# Patient Record
Sex: Female | Born: 1990
Health system: Southern US, Community
[De-identification: ages and names within clinical notes are randomized; demographics above are authoritative.]

## PROBLEM LIST (undated history)

## (undated) ENCOUNTER — Inpatient Hospital Stay (HOSPITAL_COMMUNITY): Payer: Self-pay

## (undated) DIAGNOSIS — R000 Tachycardia, unspecified: Secondary | ICD-10-CM

## (undated) DIAGNOSIS — R519 Headache, unspecified: Secondary | ICD-10-CM

## (undated) DIAGNOSIS — R51 Headache: Secondary | ICD-10-CM

## (undated) DIAGNOSIS — IMO0002 Reserved for concepts with insufficient information to code with codable children: Secondary | ICD-10-CM

## (undated) DIAGNOSIS — R109 Unspecified abdominal pain: Secondary | ICD-10-CM

## (undated) HISTORY — DX: Headache, unspecified: R51.9

## (undated) HISTORY — DX: Headache: R51

## (undated) HISTORY — PX: NO PAST SURGERIES: SHX2092

---

## 2009-11-09 ENCOUNTER — Emergency Department (HOSPITAL_COMMUNITY): Admission: EM | Admit: 2009-11-09 | Discharge: 2009-11-09 | Payer: Self-pay | Admitting: Emergency Medicine

## 2010-04-16 ENCOUNTER — Other Ambulatory Visit: Payer: Self-pay | Admitting: Emergency Medicine

## 2010-09-01 ENCOUNTER — Ambulatory Visit: Payer: Self-pay | Admitting: General Practice

## 2010-12-03 ENCOUNTER — Ambulatory Visit: Payer: Self-pay

## 2010-12-07 ENCOUNTER — Emergency Department (HOSPITAL_COMMUNITY): Payer: PRIVATE HEALTH INSURANCE

## 2010-12-07 ENCOUNTER — Emergency Department (HOSPITAL_COMMUNITY)
Admission: EM | Admit: 2010-12-07 | Discharge: 2010-12-08 | Disposition: A | Discharge status: Home / Self Care | Payer: PRIVATE HEALTH INSURANCE | Attending: Emergency Medicine | Admitting: Emergency Medicine

## 2010-12-07 ENCOUNTER — Encounter (HOSPITAL_COMMUNITY): Payer: Self-pay | Admitting: Radiology

## 2010-12-07 DIAGNOSIS — R42 Dizziness and giddiness: Secondary | ICD-10-CM | POA: Insufficient documentation

## 2010-12-07 DIAGNOSIS — R002 Palpitations: Secondary | ICD-10-CM | POA: Insufficient documentation

## 2010-12-07 DIAGNOSIS — R51 Headache: Secondary | ICD-10-CM | POA: Insufficient documentation

## 2010-12-07 LAB — CBC
HCT: 38.5 % (ref 36.0–46.0)
Hemoglobin: 13.7 g/dL (ref 12.0–15.0)
MCV: 84.4 fL (ref 78.0–100.0)
RBC: 4.56 MIL/uL (ref 3.87–5.11)
RDW: 12.5 % (ref 11.5–15.5)
WBC: 6.9 10*3/uL (ref 4.0–10.5)

## 2010-12-07 LAB — DIFFERENTIAL
Eosinophils Relative: 2 % (ref 0–5)
Lymphocytes Relative: 36 % (ref 12–46)
Lymphs Abs: 2.5 10*3/uL (ref 0.7–4.0)

## 2010-12-07 LAB — BASIC METABOLIC PANEL
BUN: 10 mg/dL (ref 6–23)
CO2: 27 mEq/L (ref 19–32)
Chloride: 103 mEq/L (ref 96–112)
Creatinine, Ser: 0.78 mg/dL (ref 0.50–1.10)
Glucose, Bld: 91 mg/dL (ref 70–99)

## 2010-12-07 LAB — URINALYSIS, ROUTINE W REFLEX MICROSCOPIC
Bilirubin Urine: NEGATIVE
Specific Gravity, Urine: 1.007 (ref 1.005–1.030)
Urobilinogen, UA: 0.2 mg/dL (ref 0.0–1.0)

## 2010-12-07 LAB — URINE MICROSCOPIC-ADD ON

## 2010-12-08 ENCOUNTER — Emergency Department (HOSPITAL_COMMUNITY): Payer: PRIVATE HEALTH INSURANCE

## 2010-12-08 ENCOUNTER — Encounter (HOSPITAL_COMMUNITY): Payer: Self-pay | Admitting: Radiology

## 2010-12-08 MED ORDER — IOHEXOL 350 MG/ML SOLN: 100.0000 mL | Freq: Once | INTRAVENOUS | Status: DC | PRN

## 2013-01-16 ENCOUNTER — Other Ambulatory Visit (HOSPITAL_COMMUNITY): Payer: Self-pay | Admitting: Obstetrics & Gynecology

## 2013-01-16 DIAGNOSIS — N979 Female infertility, unspecified: Secondary | ICD-10-CM

## 2013-01-16 DIAGNOSIS — N912 Amenorrhea, unspecified: Secondary | ICD-10-CM

## 2013-01-23 ENCOUNTER — Ambulatory Visit (HOSPITAL_COMMUNITY)
Admission: RE | Admit: 2013-01-23 | Discharge: 2013-01-23 | Disposition: A | Discharge status: Home / Self Care | Payer: 59 | Source: Ambulatory Visit | Attending: Obstetrics & Gynecology | Admitting: Obstetrics & Gynecology

## 2013-01-23 DIAGNOSIS — N912 Amenorrhea, unspecified: Secondary | ICD-10-CM

## 2013-01-23 DIAGNOSIS — N979 Female infertility, unspecified: Secondary | ICD-10-CM | POA: Insufficient documentation

## 2013-01-23 MED ORDER — IOHEXOL 300 MG/ML  SOLN
20.0000 mL | Freq: Once | INTRAMUSCULAR | Status: AC | PRN
  Administered 2013-01-23: 20 mL

## 2013-02-21 NOTE — L&D Delivery Note (Signed)
Delivery Note At 8:34 PM a viable and healthy female was delivered via Vaginal, Spontaneous Delivery (Presentation: LOA ).  APGAR: 8, 9; weight pending .   Placenta status: spontaneous, intact.  Cord: with the following complications: none.  Cord pH: na  Anesthesia:  Epidural Episiotomy:none   Lacerations:  second Suture Repair: 2.0 3.0 vicryl rapide Est. Blood Loss (mL):  300  Mom to postpartum.  Baby to Couplet care / Skin to Skin.  Jullianna Gabor J 02/16/2014, 8:49 PM

## 2013-07-24 LAB — OB RESULTS CONSOLE RUBELLA ANTIBODY, IGM: RUBELLA: IMMUNE

## 2013-07-24 LAB — OB RESULTS CONSOLE HEPATITIS B SURFACE ANTIGEN: Hepatitis B Surface Ag: NEGATIVE

## 2013-07-24 LAB — OB RESULTS CONSOLE ABO/RH: RH Type: POSITIVE

## 2013-07-24 LAB — OB RESULTS CONSOLE HIV ANTIBODY (ROUTINE TESTING): HIV: NONREACTIVE

## 2013-07-24 LAB — OB RESULTS CONSOLE RPR: RPR: NONREACTIVE

## 2013-07-24 LAB — OB RESULTS CONSOLE ANTIBODY SCREEN: ANTIBODY SCREEN: NEGATIVE

## 2013-07-31 LAB — OB RESULTS CONSOLE GC/CHLAMYDIA
Chlamydia: NEGATIVE
Gonorrhea: NEGATIVE

## 2014-01-15 ENCOUNTER — Observation Stay (HOSPITAL_COMMUNITY)
Admission: AD | Admit: 2014-01-15 | Discharge: 2014-01-16 | Disposition: A | Discharge status: Home / Self Care | Payer: 59 | Source: Ambulatory Visit | Attending: Obstetrics & Gynecology | Admitting: Obstetrics & Gynecology

## 2014-01-15 ENCOUNTER — Encounter (HOSPITAL_COMMUNITY): Payer: Self-pay | Admitting: Obstetrics & Gynecology

## 2014-01-15 ENCOUNTER — Inpatient Hospital Stay (HOSPITAL_COMMUNITY): Payer: 59

## 2014-01-15 DIAGNOSIS — Z3A34 34 weeks gestation of pregnancy: Secondary | ICD-10-CM | POA: Diagnosis not present

## 2014-01-15 DIAGNOSIS — Z79899 Other long term (current) drug therapy: Secondary | ICD-10-CM | POA: Insufficient documentation

## 2014-01-15 DIAGNOSIS — R000 Tachycardia, unspecified: Secondary | ICD-10-CM | POA: Diagnosis not present

## 2014-01-15 DIAGNOSIS — O26893 Other specified pregnancy related conditions, third trimester: Principal | ICD-10-CM | POA: Insufficient documentation

## 2014-01-15 DIAGNOSIS — R109 Unspecified abdominal pain: Secondary | ICD-10-CM

## 2014-01-15 DIAGNOSIS — IMO0002 Reserved for concepts with insufficient information to code with codable children: Secondary | ICD-10-CM | POA: Insufficient documentation

## 2014-01-15 HISTORY — DX: Tachycardia, unspecified: R00.0

## 2014-01-15 HISTORY — DX: Reserved for concepts with insufficient information to code with codable children: IMO0002

## 2014-01-15 HISTORY — DX: Unspecified abdominal pain: R10.9

## 2014-01-15 LAB — URINALYSIS, ROUTINE W REFLEX MICROSCOPIC
Bilirubin Urine: NEGATIVE
GLUCOSE, UA: NEGATIVE mg/dL
Ketones, ur: 40 mg/dL — AB
LEUKOCYTES UA: NEGATIVE
Nitrite: NEGATIVE
PROTEIN: NEGATIVE mg/dL
SPECIFIC GRAVITY, URINE: 1.01 (ref 1.005–1.030)
UROBILINOGEN UA: 0.2 mg/dL (ref 0.0–1.0)
pH: 6.5 (ref 5.0–8.0)

## 2014-01-15 LAB — CBC WITH DIFFERENTIAL/PLATELET
Basophils Absolute: 0 10*3/uL (ref 0.0–0.1)
Basophils Relative: 0 % (ref 0–1)
EOS ABS: 0.1 10*3/uL (ref 0.0–0.7)
Eosinophils Relative: 1 % (ref 0–5)
HCT: 39.9 % (ref 36.0–46.0)
HEMOGLOBIN: 14.1 g/dL (ref 12.0–15.0)
LYMPHS ABS: 0.9 10*3/uL (ref 0.7–4.0)
LYMPHS PCT: 7 % — AB (ref 12–46)
MCH: 32.3 pg (ref 26.0–34.0)
MCHC: 35.3 g/dL (ref 30.0–36.0)
MCV: 91.3 fL (ref 78.0–100.0)
Monocytes Absolute: 0.8 10*3/uL (ref 0.1–1.0)
Monocytes Relative: 6 % (ref 3–12)
NEUTROS ABS: 12 10*3/uL — AB (ref 1.7–7.7)
NEUTROS PCT: 86 % — AB (ref 43–77)
PLATELETS: 174 10*3/uL (ref 150–400)
RBC: 4.37 MIL/uL (ref 3.87–5.11)
RDW: 13.1 % (ref 11.5–15.5)
WBC: 13.8 10*3/uL — AB (ref 4.0–10.5)

## 2014-01-15 LAB — BASIC METABOLIC PANEL
Anion gap: 13 (ref 5–15)
BUN: 7 mg/dL (ref 6–23)
CHLORIDE: 100 meq/L (ref 96–112)
CO2: 23 mEq/L (ref 19–32)
Calcium: 8.4 mg/dL (ref 8.4–10.5)
Creatinine, Ser: 0.58 mg/dL (ref 0.50–1.10)
GFR calc Af Amer: >90 mL/min (ref >90)
GLUCOSE: 77 mg/dL (ref 70–99)
POTASSIUM: 3.6 meq/L — AB (ref 3.7–5.3)
SODIUM: 136 meq/L — AB (ref 137–147)

## 2014-01-15 LAB — URINE MICROSCOPIC-ADD ON

## 2014-01-15 MED ORDER — CEFTRIAXONE SODIUM IN DEXTROSE 20 MG/ML IV SOLN
1.0000 g | Freq: Once | INTRAVENOUS | Status: AC
  Administered 2014-01-15: 1 g via INTRAVENOUS
  Filled 2014-01-15: qty 50

## 2014-01-15 MED ORDER — TAMSULOSIN HCL 0.4 MG PO CAPS
0.4000 mg | ORAL_CAPSULE | Freq: Every day | ORAL | Status: DC
  Administered 2014-01-15 – 2014-01-16 (×2): 0.4 mg via ORAL
  Filled 2014-01-15 (×3): qty 1

## 2014-01-15 MED ORDER — POTASSIUM CHLORIDE CRYS ER 20 MEQ PO TBCR
20.0000 meq | EXTENDED_RELEASE_TABLET | Freq: Once | ORAL | Status: AC
  Administered 2014-01-15: 20 meq via ORAL
  Filled 2014-01-15: qty 1

## 2014-01-15 MED ORDER — DEXTROSE IN LACTATED RINGERS 5 % IV SOLN: INTRAVENOUS | Status: DC

## 2014-01-15 MED ORDER — HYDROMORPHONE HCL 1 MG/ML IJ SOLN
1.0000 mg | INTRAMUSCULAR | Status: DC | PRN
  Administered 2014-01-15: 1 mg via INTRAVENOUS
  Filled 2014-01-15: qty 1

## 2014-01-15 MED ORDER — DEXTROSE-NACL 5-0.9 % IV SOLN
INTRAVENOUS | Status: DC
  Administered 2014-01-15 – 2014-01-16 (×2): via INTRAVENOUS

## 2014-01-15 MED ORDER — CEFTRIAXONE SODIUM 1 G IJ SOLR: 1.0000 g | Freq: Once | INTRAMUSCULAR | Status: DC

## 2014-01-15 NOTE — MAU Note (Signed)
Patient states she started having right flank pain at 1200 that has gotten worse over the course of the day. Was seen in the office and sent to MAU for evaluation.  Has a lot of mild contractions today, no bleeding or leaking. Reports good fetal movement.

## 2014-01-15 NOTE — MAU Provider Note (Signed)
History     CSN: [id number]  Arrival date and time: 01/15/14 1739     Chief Complaint  Patient presents with  . Flank Pain   HPI Comments: G1 @34 .3 wks c/o right flank pain with acute onset earlier today. Since then has noticed polyuria and urgency. No hematuria. No fever or chills. No N/V. Reports decreased appetite since this am. Good FM. No LOF or VB. Irregular, non-painful ctx.   Flank Pain    OB History    Gravida Para Term Preterm AB TAB SAB Ectopic Multiple Living   1               Past Medical History  Diagnosis Date  . Acute right flank pain 01/15/2014  . Marginal insertion of umbilical cord 83/38/2505  . Tachycardia     Past Surgical History  Procedure Laterality Date  . No past surgeries      History reviewed. No pertinent family history.  History  Substance Use Topics  . Smoking status: Never Smoker   . Smokeless tobacco: Not on file  . Alcohol Use: No    Allergies: No Known Allergies  Prescriptions prior to admission  Medication Sig Dispense Refill Last Dose  . Prenatal Vit-Fe Fumarate-FA (PRENATAL MULTIVITAMIN) TABS tablet Take 1 tablet by mouth at bedtime.   Past Week at Unknown time    Review of Systems  Constitutional: Negative.   HENT: Negative.   Eyes: Negative.   Respiratory: Negative.   Cardiovascular: Negative.   Gastrointestinal: Negative.   Genitourinary: Positive for urgency, frequency and flank pain.  Musculoskeletal: Negative.   Skin: Negative.   Neurological: Negative.   Endo/Heme/Allergies: Negative.   Psychiatric/Behavioral: Negative.    Physical Exam   Blood pressure 116/63, pulse 109, temperature 98.6 F (37 C), temperature source Oral, resp. rate 16, height 5\' 7"  (1.702 m), weight 72.576 kg (160 lb), SpO2 100 %.  Physical Exam  Constitutional: She is oriented to person, place, and time. She appears well-developed and well-nourished.  HENT:  Head: Normocephalic and atraumatic.  Eyes: Pupils are equal, round,  and reactive to light.  Neck: Normal range of motion. Neck supple.  Cardiovascular: Normal rate and regular rhythm.   Respiratory: Effort normal and breath sounds normal.  GI: Soft. Bowel sounds are normal. She exhibits no distension. There is no tenderness. There is no rebound and no guarding.  Gravid +CVAT on right  Genitourinary:  SVE: 0/0/high  Musculoskeletal: Normal range of motion.  Neurological: She is alert and oriented to person, place, and time.  Skin: Skin is warm and dry.  Psychiatric: She has a normal mood and affect.  EFM: 135 bpm, mod variability, + accels, no decels Toco: irregular, mild  MAU Course  Procedures CBC UA-+ketones, +blood Sono:  FINDINGS: Right Kidney: Length: 14.3 cm. Echogenicity and renal cortical thickness are within normal limits. No mass or perinephric fluid visualized. There is moderate hydronephrosis and proximal ureterectasis on the right. No calculus is appreciable in the right kidney or visualize proximal right ureter. Left Kidney: Length: 12.6 cm. Echogenicity and renal cortical thickness are within normal limits. No mass or perinephric fluid visualized. There is minimal pelvicaliectasis on the left. No sonographically demonstrable calculus or ureterectasis. Bladder: Appears normal for degree of bladder distention. There is a third trimester intrauterine gestation causing impression on the urinary bladder. IMPRESSION: Moderate hydronephrosis and ureterectasis on the right. This finding is concerning for potential distal ureteral obstruction. Minimal fullness of the left renal collecting system may be due  to the intrauterine gestation causing impression on the left ureter. There is no mass. There is no visualized intrarenal or proximal right ureteral calculus. Study otherwise unremarkable.  MDM n/a  Assessment and Plan  34.[redacted] weeks gestation Suspected pyelonephritis  Admit, IV hydration and abx, analgesic, strain urine.   Dr. Benjie Karvonen to follow.   Anna Martin, [place], N 01/15/2014, 8:17 PM

## 2014-01-15 NOTE — H&P (Signed)
CC: Flank Pain since this afternoon, getting worse.   23 yo, G2P0 at 34.3 wks, presented back to office this afternoon with acute right flank pain and urinary frequency, urgency. No hematuria or dysuria. No renal stone hx in past. No fever/ chills. Does not have an appetite since flank pain started and has not been able to eat much all day but is able to drink a lot of water without nausea/vomiting. Patient woke up feeling fine, came to office for Ob routine visit, was evaluated with flank taps to check for CVA tenderness due to c/o noting blood on wiping after BM few days back, no vaginal bleeding noted.  Udip then was neg gluc/prot but this afternoon complete UA done that noted Ketones and small blood but neg Leuk/nitrates.  Feels some UCs, not very painful. No vag bleeding or leaking fluid. Good FMs noted.   Past Medical History  Diagnosis Date  . Acute right flank pain 01/15/2014  . Marginal insertion of umbilical cord 40/34/7425  . Tachycardia    Past Surgical History  Procedure Laterality Date  . No past surgeries      History reviewed. No pertinent family history.  History  Substance Use Topics  . Smoking status: Never Smoker   . Smokeless tobacco: Not on file  . Alcohol Use: No    Allergies: No Known Allergies  Prescriptions prior to admission  Medication Sig Dispense Refill Last Dose  . Prenatal Vit-Fe Fumarate-FA (PRENATAL MULTIVITAMIN) TABS tablet Take 1 tablet by mouth at bedtime.   Past Week at Unknown time    Review of Systems  Genitourinary: Positive for flank pain.   Physical Exam   Physical Exam  A&O x 3, no acute distress but appears uncomfortable HEENT negLungs CTA bilat CV mild tachycardia noted in 100s Abdo soft, non tender, non acute, uterus gravid, relaxed with palpable BH contractions. Rest of the abdomen in non tender/ no rebound/ guarding.  Back Right SI joint tenderness noted and also right flank tenderness noted.  Extr no edema/ tenderness.  Psoas test/ hip flexion negative.  Pelvic deferred   MAU Course  Procedures PO hydrate with juice, repeat UA in 1 hr for ketones and blood CBCD, BMP - labs reviewed, low K, hight WBC with left shift Renal sono - Right moderate hydronephrosis with ureter dilatation, no obvious stones noted NST- reactive.   Assessment and Plan  23 yo with acute right flank tenderness. 34.3 wk pregnancy.  D/D- Right ureteral stone with hydronephrosis or acute right pyelonephritis. Less likely appendicitis (CBCD pending, though normal non tender/ non surgical abdomen).  Admit, Rocephin, Flomax, Dilauded PRN, IVFluids, strain urine, replace low K, NST daily. CBCD in AM.    Prudencio Velazco R 01/15/2014, 8:03 PM

## 2014-01-15 NOTE — MAU Provider Note (Signed)
  History     CSN: [id number]  Arrival date and time: 01/15/14 1739   Chief Complaint  Patient presents with  . Flank Pain   HPI 23 yo, G2P0 at 34 wks, presented back to office this afternoon with acute right flank pain and urinary frequency, urgency. No hematuria or dysuria. No renal stone hx in past. No fever/ chills. Does not have an appetite since flank pain started and has not been able to eat much all day but is able to drink a lot of water without nausea/vomiting. Patient woke up feeling fine, came to office for Ob routine visit, was evaluated with flank taps to check for CVA tenderness due to c/o noting blood on wiping after BM few days back, no vaginal bleeding noted.  Udip then was neg gluc/prot but this afternoon complete UA done that noted Ketones and small blood but neg Leuk/nitrates.   Past Medical History  Diagnosis Date  . Acute right flank pain 01/15/2014  . Marginal insertion of umbilical cord 27/07/2374   No past surgical history on file.  No family history on file.  History  Substance Use Topics  . Smoking status: Not on file  . Smokeless tobacco: Not on file  . Alcohol Use: Not on file    Allergies: No Known Allergies  No prescriptions prior to admission    ROS Physical Exam   Physical Exam  A&O x 3, no acute distress but appears uncomfortable HEENT negLungs CTA bilat CV mild tachycardia noted in 100s Abdo soft, non tender, non acute, uterus gravid, relaxed with palpable BH contractions. Rest of the abdomen in non tender/ no rebound/ guarding.  Back Right SI joint tenderness noted and also right flank tenderness noted.  Extr no edema/ tenderness. Psoas test/ hip flexion negative.  Pelvic deferred   MAU Course  Procedures PO hydrate with juice, repeat UA in 1 hr for ketones and blood CBCD, BMP Renal sono  NST  Assessment and Plan  23 yo with acute right flank tenderness. 34 wk pregnancy D/D- Right renal stone/ hydronephrosis of pregnancy/  musculoskeletal pain/ right pyelonephritis (though don't see leuk/nitrate), more atypical- appendicitis (CBCD pending, though normal non tender/ non surgical abdomen)./   Anna Martin R 01/15/2014, 5:54 PM

## 2014-01-16 LAB — CBC WITH DIFFERENTIAL/PLATELET
BASOS ABS: 0 10*3/uL (ref 0.0–0.1)
Basophils Relative: 0 % (ref 0–1)
Eosinophils Absolute: 0.2 10*3/uL (ref 0.0–0.7)
Eosinophils Relative: 2 % (ref 0–5)
HCT: 35.2 % — ABNORMAL LOW (ref 36.0–46.0)
Hemoglobin: 12.2 g/dL (ref 12.0–15.0)
Lymphocytes Relative: 14 % (ref 12–46)
Lymphs Abs: 1.1 10*3/uL (ref 0.7–4.0)
MCH: 31.8 pg (ref 26.0–34.0)
MCHC: 34.7 g/dL (ref 30.0–36.0)
MCV: 91.7 fL (ref 78.0–100.0)
Monocytes Absolute: 0.6 10*3/uL (ref 0.1–1.0)
Monocytes Relative: 7 % (ref 3–12)
Neutro Abs: 6.3 10*3/uL (ref 1.7–7.7)
Neutrophils Relative %: 77 % (ref 43–77)
PLATELETS: 145 10*3/uL — AB (ref 150–400)
RBC: 3.84 MIL/uL — ABNORMAL LOW (ref 3.87–5.11)
RDW: 13.1 % (ref 11.5–15.5)
WBC: 8.1 10*3/uL (ref 4.0–10.5)

## 2014-01-16 MED ORDER — CEPHALEXIN 500 MG PO CAPS: 500.0000 mg | ORAL_CAPSULE | Freq: Two times a day (BID) | ORAL | Status: DC

## 2014-01-16 MED ORDER — TAMSULOSIN HCL 0.4 MG PO CAPS: 0.4000 mg | ORAL_CAPSULE | Freq: Every day | ORAL | Status: DC

## 2014-01-16 MED ORDER — CEFTRIAXONE SODIUM IN DEXTROSE 20 MG/ML IV SOLN
1.0000 g | Freq: Two times a day (BID) | INTRAVENOUS | Status: DC
  Administered 2014-01-16: 1 g via INTRAVENOUS
  Filled 2014-01-16 (×2): qty 50

## 2014-01-16 MED ORDER — ACETAMINOPHEN 500 MG PO TABS: 500.0000 mg | ORAL_TABLET | Freq: Four times a day (QID) | ORAL | Status: DC | PRN

## 2014-01-16 NOTE — Progress Notes (Signed)
Pt ambulated out teaching complete  Questions  Answered

## 2014-01-16 NOTE — Plan of Care (Signed)
Problem: Phase I Progression Outcomes Goal: Pain controlled with appropriate interventions Outcome: Completed/Met Date Met:  01/16/14 Goal: OOB as tolerated unless otherwise ordered Outcome: Completed/Met Date Met:  01/16/14 Goal: Voiding-avoid urinary catheter unless indicated Outcome: Completed/Met Date Met:  01/16/14 Goal: Hemodynamically stable Outcome: Completed/Met Date Met:  01/16/14 Goal: Other Phase I Outcomes/Goals Outcome: Not Applicable Date Met:  84/12/82

## 2014-01-16 NOTE — Discharge Instructions (Signed)
Pyelonephritis, Adult Pyelonephritis is a kidney infection. A kidney infection can happen quickly, or it can last for a long time. HOME CARE   Take your medicine (antibiotics) as told. Finish it even if you start to feel better.  Keep all doctor visits as told.  Drink enough fluids to keep your pee (urine) clear or pale yellow.  Only take medicine as told by your doctor. GET HELP RIGHT AWAY IF:   You have a fever or lasting symptoms for more than 2-3 days.  You have a fever and your symptoms suddenly get worse.  You cannot take your medicine or drink fluids as told.  You have chills and shaking.  You feel very weak or pass out (faint).  You do not feel better after 2 days. MAKE SURE YOU:  Understand these instructions.  Will watch your condition.  Will get help right away if you are not doing well or get worse. Document Released: 03/17/2004 Document Revised: 08/09/2011 Document Reviewed: 07/28/2010 [facility] Patient Information 2015 [place], [place]. This information is not intended to replace advice given to you by your health care provider. Make sure you discuss any questions you have with your health care provider. Hydronephrosis Hydronephrosis is an abnormal enlargement of your kidney. It can affect one or both the kidneys. It results from the backward pressure of urine on the kidneys, when the flow of urine is blocked. Normally, the urine drains from the kidney through the urine tube (ureter), into a sac which holds the urine until urination (bladder). When the urinary flow is blocked, the urine collects above the block. This causes an increase in the pressure inside the kidney, which in turn leads to its enlargement. The block can occur at the point where the kidney joins the ureter. Treatment depends on the cause and location of the block.  CAUSES  The causes of this condition include:  Birth defect of the kidney or ureter.  Kink at the point where the kidney joins the  ureter.  Stones and blood clots in the kidney or ureter.  Cancer, injury, or infection of the ureter.  Scar tissue formation.  Backflow of urine (reflux).  Cancer of bladder or prostate gland.  Abnormality of the nerves or muscles of the kidney or ureter.  Lower part of the ureter protruding into the bladder (ureterocele).  Abnormal contractions of the bladder.  Both the kidneys can be affected during pregnancy. This is because the enlarging uterus presses on the ureters and blocks the flow of urine. SYMPTOMS  The symptoms depend on the location of the block. They also depend on how long the block has been present. You may feel pain on the affected side. Sometimes, you may not have any symptoms. There may be a dull ache or discomfort in the flank. The common symptoms are:  Flank pain.  Swelling of the abdomen.  Pain in the abdomen.  Nausea and vomiting.  Fever.  Pain while passing urine.  Urgency for urination.  Frequent or urgent urination.  Infection of the urinary tract. DIAGNOSIS  Your caregiver will examine you after asking about your symptoms. You may be asked to do blood and urine tests. Your caregiver may order a special X-ray, ultrasound, or CT scan. Sometimes a rigid or flexible telescope (cystoscope) is used to view the site of the blockage.  TREATMENT  Treatment depends on the site, cause, and duration of the block. The goal of treatment is to remove the blockage. Your caregiver will plan the treatment  based on your condition. The different types of treatment are:   Putting in a soft plastic tube (ureteral stent) to connect the bladder with the kidney. This will help in draining the urine.  Putting in a soft tube (nephrostomy tube). This is placed through skin into the kidney. The trapped urine is drained out through the back. A plastic bag is attached to your skin to hold the urine that has drained out.  Antibiotics to treat or prevent  infection.  Breaking down of the stone (lithotripsy). HOME CARE INSTRUCTIONS   It may take some time for the hydronephrosis to go away (resolve). Drink fluids as directed by your caregiver , and get a lot of rest.  If you have a drain in, your caregiver will give you directions about how to care for it. Be sure you understand these directions completely before you go home.  Take any antibiotics, pain medications, or other prescriptions exactly as prescribed.  Follow-up with your caregivers as directed. SEEK MEDICAL CARE IF:   You continue to have flank pain, nausea, or difficulty with urination.  You have any problem with any type of drainage device.  Your urine becomes cloudy or bloody. SEEK IMMEDIATE MEDICAL CARE IF:   You have severe flank and/or abdominal pain.  You develop vomiting and are unable to hold down fluids.  You develop a fever above 100.5 F (38.1 C), or as per your caregiver. MAKE SURE YOU:   Understand these instructions.  Will watch your condition.  Will get help right away if you are not doing well or get worse. Document Released: 12/05/2006 Document Revised: 05/02/2011 Document Reviewed: 01/21/2010 [facility] Patient Information 2015 [place], [place]. This information is not intended to replace advice given to you by your health care provider. Make sure you discuss any questions you have with your health care provider.

## 2014-01-16 NOTE — Progress Notes (Signed)
Subjective: Feels very well. Right flank pain much reduced. No fever/ chills.  Urinary frequency has resolved. Good FMs. UCs have resolved. No vag bleeding/ leaking fluid   Objective: Vital signs in last 24 hours: Temp:  [97.9 F (36.6 C)-98.6 F (37 C)] 98.1 F (36.7 C) (11/26 1000) Pulse Rate:  [77-124] 92 (11/26 1000) Resp:  [16-18] 18 (11/26 1000) BP: (98-145)/(49-82) 121/67 mmHg (11/26 1000) SpO2:  [98 %-100 %] 99 % (11/26 1000) Weight:  [157 lb 8 oz (71.442 kg)-160 lb (72.576 kg)] 157 lb 8 oz (71.442 kg) (11/26 0600) Weight change:  Last BM Date: 01/15/14  Intake/Output from previous day: 11/25 0701 - 11/26 0700 In: 1686.8 [P.O.:700; I.V.:936.8; IV Piggyback:50] Out: 2800 [Urine:2800] Intake/Output this shift: Total I/O In: 960 [P.O.:960] Out: 1550 [Urine:1550]  Physical exam:  A&O x 3, no acute distress. Pleasant Lungs CTA bilat CV RRR, S1S2 normal Abdo soft, non tender, non acute, soft gravid relaxed uterus Extr no edema/ tenderness Pelvic N/A NST - reactive.   Lab Results:  Recent Labs  01/15/14 1746 01/16/14 0800  WBC 13.8* 8.1  HGB 14.1 12.2  HCT 39.9 35.2*  PLT 174 145*   BMET  Recent Labs  01/15/14 1746  NA 136*  K 3.6*  CL 100  CO2 23  GLUCOSE 77  BUN 7  CREATININE 0.58  CALCIUM 8.4    Studies/Results: US Renal  01/15/2014   CLINICAL DATA:  Acute onset right flank pain  EXAM: RENAL/URINARY TRACT ULTRASOUND COMPLETE  COMPARISON:  None.  FINDINGS: Right Kidney:  Length: 14.3 cm. Echogenicity and renal cortical thickness are within normal limits. No mass or perinephric fluid visualized. There is moderate hydronephrosis and proximal ureterectasis on the right. No calculus is appreciable in the right kidney or visualize proximal right ureter.  Left Kidney:  Length: 12.6 cm. Echogenicity and renal cortical thickness are within normal limits. No mass or perinephric fluid visualized. There is minimal pelvicaliectasis on the left. No  sonographically demonstrable calculus or ureterectasis.  Bladder:  Appears normal for degree of bladder distention. There is a third trimester intrauterine gestation causing impression on the urinary bladder.  IMPRESSION: Moderate hydronephrosis and ureterectasis on the right. This finding is concerning for potential distal ureteral obstruction. Minimal fullness of the left renal collecting system may be due to the intrauterine gestation causing impression on the left ureter. There is no mass. There is no visualized intrarenal or proximal right ureteral calculus. Study otherwise unremarkable.   Electronically Signed   By: Lowella Grip M.D.   On: 01/15/2014 19:29    Medications: I have reviewed the patient's current medications.  Assessment/Plan: HD#2. Right flank pain/ right hydronephrosis/ possible right pyelonephritis or renal stone.  S/p Ceftriaxone x 2 doses, Flomax x 1 dose, Dilaudid x 1 dose and feels a lot better. No stone noted in strained urine. WBC better. HypoK - K replaced.  NST - reactive x 2.   Plan D/c home. PTL prec, cont Keflex until office urine culture back and plan low dose if culture negative. F/up in office, will repeat sono and possible Uro consult if not resolved.   Beanca Kiester R 01/16/2014, 1:50 PM

## 2014-01-16 NOTE — Plan of Care (Signed)
Problem: Consults Goal: General Medical Patient Education See Patient Education Module for specific education. Outcome: Completed/Met Date Met:  01/16/14

## 2014-01-16 NOTE — Discharge Summary (Signed)
Physician Discharge Summary  Patient ID: Addalynne Golding MRN: [id number] DOB/AGE: 25/29/1992 23 y.o.  Admit date: 01/15/2014 Discharge date: 01/16/2014  Admission Diagnoses: Acute right flank pain, right moderate hydronephrosis/ hydroureter  Discharge Diagnoses: Same   Discharged Condition: good  Hospital Course: Patient given Ceftriaxone, Flomax, Dilaudid and IV fluids and potassium was replaced. NST x2 reactive. Patient improved over 24 hr hospital stay. Discharged home.   Significant Diagnostic Studies: labs: CBC, BMP. Radiology: Ultrasound: Renal   Treatments: IV hydration, antibiotics: ceftriaxone, analgesia: Dilaudid and Potassium  Discharge Exam: Blood pressure 121/67, pulse 92, temperature 98.1 F (36.7 C), temperature source Oral, resp. rate 18, height 5\' 7"  (1.702 m), weight 157 lb 8 oz (71.442 kg), SpO2 99 %. Normal soft, gravid uterus, no CVA tenderness noted.   Disposition: 01-Home or Self Care  Discharge Instructions    Call MD for:  persistant nausea and vomiting    Complete by:  As directed      Call MD for:  severe uncontrolled pain    Complete by:  As directed      Call MD for:  temperature >100.4    Complete by:  As directed      Diet - low sodium heart healthy    Complete by:  As directed      Discharge instructions    Complete by:  As directed   Check fetal kick counts. Monitor contractions that are painful, monitor vaginal bleeding and leaking of fluid.     Increase activity slowly    Complete by:  As directed             Medication List    TAKE these medications        acetaminophen 500 MG tablet  Commonly known as:  TYLENOL  Take 1 tablet (500 mg total) by mouth every 6 (six) hours as needed.     cephALEXin 500 MG capsule  Commonly known as:  KEFLEX  Take 1 capsule (500 mg total) by mouth 2 (two) times daily. Start at 8 pm tonight and take at 8 am and 8 pm daily     prenatal multivitamin Tabs tablet  Take 1 tablet by mouth at bedtime.      tamsulosin 0.4 MG Caps capsule  Commonly known as:  FLOMAX  Take 1 capsule (0.4 mg total) by mouth daily.           Follow-up Information    Follow up with Jaquelyne Firkus R, MD. Schedule an appointment as soon as possible for a visit in 5 days.   Specialty:  Obstetrics and Gynecology   Contact information:   [address] [place] [address] [phone]       Signed: Elveria Royals 01/16/2014, 1:58 PM

## 2014-01-23 ENCOUNTER — Ambulatory Visit: Payer: Self-pay | Admitting: Obstetrics & Gynecology

## 2014-01-25 ENCOUNTER — Inpatient Hospital Stay (HOSPITAL_COMMUNITY)
Admission: AD | Admit: 2014-01-25 | Discharge: 2014-01-25 | Disposition: A | Discharge status: Home / Self Care | Payer: 59 | Source: Ambulatory Visit | Attending: Obstetrics & Gynecology | Admitting: Obstetrics & Gynecology

## 2014-01-25 ENCOUNTER — Encounter (HOSPITAL_COMMUNITY): Payer: Self-pay | Admitting: *Deleted

## 2014-01-25 DIAGNOSIS — W19XXXA Unspecified fall, initial encounter: Secondary | ICD-10-CM

## 2014-01-25 DIAGNOSIS — W109XXA Fall (on) (from) unspecified stairs and steps, initial encounter: Secondary | ICD-10-CM | POA: Insufficient documentation

## 2014-01-25 DIAGNOSIS — Z3A35 35 weeks gestation of pregnancy: Secondary | ICD-10-CM | POA: Insufficient documentation

## 2014-01-25 DIAGNOSIS — O9989 Other specified diseases and conditions complicating pregnancy, childbirth and the puerperium: Secondary | ICD-10-CM | POA: Insufficient documentation

## 2014-01-25 DIAGNOSIS — O360930 Maternal care for other rhesus isoimmunization, third trimester, not applicable or unspecified: Secondary | ICD-10-CM | POA: Insufficient documentation

## 2014-01-25 DIAGNOSIS — O3663X0 Maternal care for excessive fetal growth, third trimester, not applicable or unspecified: Secondary | ICD-10-CM | POA: Diagnosis not present

## 2014-01-25 DIAGNOSIS — Z3689 Encounter for other specified antenatal screening: Secondary | ICD-10-CM

## 2014-01-25 NOTE — Progress Notes (Signed)
Colman Cater CNM updated on pt's FHR, contraction pattern, orders received to discharge pt home

## 2014-01-25 NOTE — MAU Provider Note (Signed)
  History     CSN: [id number]  Arrival date and time: 01/25/14 1011   None     Chief Complaint  Patient presents with  . Fall   HPI Comments: G1 @35 .6 wks here after fall down stairs this am @0830 . Reports fell down about 4-5 stairs landing on left shoulder and side. No LOC. No abdominal contact. No VB or ROM. +BH ctx. Good FM. Pregnancy complicated by episode of pyelo and LGA.    OB History    Gravida Para Term Preterm AB TAB SAB Ectopic Multiple Living   1               Past Medical History  Diagnosis Date  . Acute right flank pain 01/15/2014  . Marginal insertion of umbilical cord 30/08/6224  . Tachycardia     Past Surgical History  Procedure Laterality Date  . No past surgeries      History reviewed. No pertinent family history.  History  Substance Use Topics  . Smoking status: Never Smoker   . Smokeless tobacco: Not on file  . Alcohol Use: No    Allergies: No Known Allergies  Prescriptions prior to admission  Medication Sig Dispense Refill Last Dose  . acetaminophen (TYLENOL) 500 MG tablet Take 1 tablet (500 mg total) by mouth every 6 (six) hours as needed. 30 tablet 0 01/25/2014 at Unknown time  . cephALEXin (KEFLEX) 500 MG capsule Take 1 capsule (500 mg total) by mouth 2 (two) times daily. Start at 8 pm tonight and take at 8 am and 8 pm daily 26 capsule 0 01/24/2014 at Unknown time  . Prenatal Vit-Fe Fumarate-FA (PRENATAL MULTIVITAMIN) TABS tablet Take 1 tablet by mouth at bedtime.   01/24/2014 at Unknown time  . tamsulosin (FLOMAX) 0.4 MG CAPS capsule Take 1 capsule (0.4 mg total) by mouth daily. 14 capsule 0 01/24/2014 at Unknown time    Review of Systems  Constitutional: Negative.   HENT: Negative.   Eyes: Negative.   Respiratory: Negative.   Cardiovascular: Negative.   Gastrointestinal: Negative.   Genitourinary: Negative.   Musculoskeletal:       Left shoulder tenderness  Skin: Negative.   Neurological: Negative.   Endo/Heme/Allergies: Negative.    Psychiatric/Behavioral: Negative.    Physical Exam   Blood pressure 134/71, pulse 104, temperature 98.3 F (36.8 C), resp. rate 16.  Physical Exam  Constitutional: She is oriented to person, place, and time. She appears well-developed and well-nourished.  HENT:  Head: Normocephalic and atraumatic.  Eyes: Pupils are equal, round, and reactive to light.  Neck: Normal range of motion. Neck supple.  Cardiovascular: Normal rate.   Respiratory: Effort normal.  GI: Soft. There is no tenderness. There is no guarding.  gravid  Genitourinary: Vagina normal.  SVE: 1/40/-2, vtx  Musculoskeletal: Normal range of motion.  Neurological: She is alert and oriented to person, place, and time.  Skin: Skin is warm and dry.  Psychiatric: She has a normal mood and affect.  EFM: 145 bpm, mod variability, +accels, no decels Toco: irregular, mild  MAU Course  Procedures NST Prolonged EFM  MDM n/a  Assessment and Plan  35.[redacted] weeks gestation S/p fall Reactive NST-no evidence of abruption Rh pos  Discharge home PTL precautions/abruption precautions FMCs Follow-up as scheduled this week  Algernon Mundie, N 01/25/2014, 11:58 AM

## 2014-01-25 NOTE — MAU Note (Signed)
Pt presents to MAU with complaints of falling this morning down 6-7 stairs and reports she landed on her left side, shoulder and the left side of her abdomen. Denies any vaginal bleeding, or LOF

## 2014-01-25 NOTE — Discharge Instructions (Signed)

## 2014-01-25 NOTE — Progress Notes (Signed)
Julianne Handler CNM notified of pt's complaints, states she will come and evaluate pt.

## 2014-02-05 LAB — OB RESULTS CONSOLE GBS: STREP GROUP B AG: NEGATIVE

## 2014-02-12 ENCOUNTER — Telehealth (HOSPITAL_COMMUNITY): Payer: Self-pay | Admitting: *Deleted

## 2014-02-12 ENCOUNTER — Other Ambulatory Visit: Payer: Self-pay | Admitting: Obstetrics

## 2014-02-12 ENCOUNTER — Other Ambulatory Visit: Payer: Self-pay | Admitting: Obstetrics and Gynecology

## 2014-02-12 ENCOUNTER — Encounter (HOSPITAL_COMMUNITY): Payer: Self-pay | Admitting: *Deleted

## 2014-02-12 NOTE — Telephone Encounter (Signed)
Preadmission screen  

## 2014-02-15 ENCOUNTER — Inpatient Hospital Stay (HOSPITAL_COMMUNITY)
Admission: RE | Admit: 2014-02-15 | Discharge: 2014-02-18 | DRG: 775 | Disposition: A | Discharge status: Home / Self Care | Payer: 59 | Source: Ambulatory Visit | Attending: Obstetrics and Gynecology | Admitting: Obstetrics and Gynecology

## 2014-02-15 ENCOUNTER — Encounter (HOSPITAL_COMMUNITY): Payer: Self-pay

## 2014-02-15 DIAGNOSIS — Z3A39 39 weeks gestation of pregnancy: Secondary | ICD-10-CM | POA: Diagnosis present

## 2014-02-15 DIAGNOSIS — O403XX0 Polyhydramnios, third trimester, not applicable or unspecified: Principal | ICD-10-CM | POA: Diagnosis present

## 2014-02-15 DIAGNOSIS — Z3490 Encounter for supervision of normal pregnancy, unspecified, unspecified trimester: Secondary | ICD-10-CM

## 2014-02-15 LAB — CBC
HEMATOCRIT: 36.4 % (ref 36.0–46.0)
Hemoglobin: 13.1 g/dL (ref 12.0–15.0)
MCH: 32 pg (ref 26.0–34.0)
MCHC: 36 g/dL (ref 30.0–36.0)
MCV: 89 fL (ref 78.0–100.0)
Platelets: 184 10*3/uL (ref 150–400)
RBC: 4.09 MIL/uL (ref 3.87–5.11)
RDW: 12.5 % (ref 11.5–15.5)
WBC: 9.5 10*3/uL (ref 4.0–10.5)

## 2014-02-15 LAB — TYPE AND SCREEN
ABO/RH(D): A POS
ANTIBODY SCREEN: NEGATIVE

## 2014-02-15 MED ORDER — LIDOCAINE HCL (PF) 1 % IJ SOLN
30.0000 mL | INTRAMUSCULAR | Status: DC | PRN
  Filled 2014-02-15: qty 30

## 2014-02-15 MED ORDER — OXYTOCIN 40 UNITS IN LACTATED RINGERS INFUSION - SIMPLE MED
62.5000 mL/h | INTRAVENOUS | Status: DC
  Administered 2014-02-16: 62.5 mL/h via INTRAVENOUS

## 2014-02-15 MED ORDER — OXYTOCIN 40 UNITS IN LACTATED RINGERS INFUSION - SIMPLE MED
1.0000 m[IU]/min | INTRAVENOUS | Status: DC
  Administered 2014-02-16: 2 m[IU]/min via INTRAVENOUS
  Filled 2014-02-15: qty 1000

## 2014-02-15 MED ORDER — OXYCODONE-ACETAMINOPHEN 5-325 MG PO TABS: 1.0000 | ORAL_TABLET | ORAL | Status: DC | PRN

## 2014-02-15 MED ORDER — FLEET ENEMA 7-19 GM/118ML RE ENEM: 1.0000 | ENEMA | Freq: Every day | RECTAL | Status: DC | PRN

## 2014-02-15 MED ORDER — MISOPROSTOL 25 MCG QUARTER TABLET
25.0000 ug | ORAL_TABLET | ORAL | Status: DC | PRN
  Administered 2014-02-15 – 2014-02-16 (×3): 25 ug via VAGINAL
  Filled 2014-02-15 (×3): qty 0.25
  Filled 2014-02-15: qty 1

## 2014-02-15 MED ORDER — LACTATED RINGERS IV SOLN
INTRAVENOUS | Status: DC
  Administered 2014-02-15: 21:00:00 via INTRAVENOUS
  Administered 2014-02-16 (×3): 125 mL/h via INTRAVENOUS

## 2014-02-15 MED ORDER — ZOLPIDEM TARTRATE 5 MG PO TABS: 5.0000 mg | ORAL_TABLET | Freq: Every evening | ORAL | Status: DC | PRN

## 2014-02-15 MED ORDER — ACETAMINOPHEN 325 MG PO TABS: 650.0000 mg | ORAL_TABLET | ORAL | Status: DC | PRN

## 2014-02-15 MED ORDER — ONDANSETRON HCL 4 MG/2ML IJ SOLN: 4.0000 mg | Freq: Four times a day (QID) | INTRAMUSCULAR | Status: DC | PRN

## 2014-02-15 MED ORDER — TERBUTALINE SULFATE 1 MG/ML IJ SOLN: 0.2500 mg | Freq: Once | INTRAMUSCULAR | Status: AC | PRN

## 2014-02-15 MED ORDER — OXYTOCIN BOLUS FROM INFUSION
500.0000 mL | INTRAVENOUS | Status: DC
  Administered 2014-02-16: 500 mL via INTRAVENOUS

## 2014-02-15 MED ORDER — LACTATED RINGERS IV SOLN
500.0000 mL | INTRAVENOUS | Status: DC | PRN
  Administered 2014-02-16: 1000 mL via INTRAVENOUS

## 2014-02-15 MED ORDER — OXYCODONE-ACETAMINOPHEN 5-325 MG PO TABS: 2.0000 | ORAL_TABLET | ORAL | Status: DC | PRN

## 2014-02-15 MED ORDER — CITRIC ACID-SODIUM CITRATE 334-500 MG/5ML PO SOLN: 30.0000 mL | ORAL | Status: DC | PRN

## 2014-02-15 NOTE — H&P (Signed)
Anna Martin is a 23 y.o. female presenting for IOL for Marginal CI and polyhydramnios.  Maternal Medical History:  Contractions: Onset was less than 1 hour ago.   Frequency: rare.   Perceived severity is mild.    Fetal activity: Perceived fetal activity is normal.   Last perceived fetal movement was within the past hour.    Prenatal complications: Placental abnormality and polyhydramnios.   Prenatal Complications - Diabetes: none.    OB History    Gravida Para Term Preterm AB TAB SAB Ectopic Multiple Living   1              Past Medical History  Diagnosis Date  . Acute right flank pain 01/15/2014  . Marginal insertion of umbilical cord 72/10/4707  . Tachycardia    Past Surgical History  Procedure Laterality Date  . No past surgeries     Family History: family history includes Hypertension in her maternal grandfather and maternal grandmother; Parkinsonism in her mother. Social History:  reports that she has never smoked. She does not have any smokeless tobacco history on file. She reports that she does not drink alcohol or use illicit drugs.   Prenatal Transfer Tool  Maternal Diabetes: No Genetic Screening: Normal Maternal Ultrasounds/Referrals: Abnormal:  Findings:   Other:Marginal CI Fetal Ultrasounds or other Referrals:  None Maternal Substance Abuse:  No Significant Maternal Medications:  None Significant Maternal Lab Results:  None Other Comments:  None- polyhydramnios  Review of Systems  All other systems reviewed and are negative.     There were no vitals taken for this visit. Maternal Exam:  Uterine Assessment: Contraction strength is mild.  Contraction frequency is rare.   Abdomen: Patient reports no abdominal tenderness. Fetal presentation: vertex  Introitus: Normal vulva. Normal vagina.  Ferning test: not done.  Nitrazine test: not done. Amniotic fluid character: not assessed.  Pelvis: adequate for delivery.   Cervix: Cervix evaluated by  digital exam.     Physical Exam  Nursing note and vitals reviewed. Constitutional: She is oriented to person, place, and time. She appears well-developed and well-nourished.  HENT:  Head: Normocephalic and atraumatic.  Neck: Normal range of motion. Neck supple.  Cardiovascular: Normal rate and regular rhythm.   Respiratory: Effort normal and breath sounds normal.  GI: Soft. Bowel sounds are normal.  Genitourinary: Vagina normal and uterus normal.  Musculoskeletal: Normal range of motion.  Neurological: She is alert and oriented to person, place, and time. She has normal reflexes.  Skin: Skin is warm and dry.  Psychiatric: She has a normal mood and affect.    Prenatal labs: ABO, Rh: A/Positive/-- (06/03 0000) Antibody: Negative (06/03 0000) Rubella: Immune (06/03 0000) RPR: Nonreactive (06/03 0000)  HBsAg: Negative (06/03 0000)  HIV: Non-reactive (06/03 0000)  GBS: Negative (12/16 0000)   Assessment/Plan: 39 weeks Marginal CI Polyhydramnios Admit, Cytotec, Pitocin in am   Labaron Digirolamo J 02/15/2014, 8:09 PM

## 2014-02-16 ENCOUNTER — Inpatient Hospital Stay (HOSPITAL_COMMUNITY): Payer: 59 | Admitting: Anesthesiology

## 2014-02-16 ENCOUNTER — Encounter (HOSPITAL_COMMUNITY): Payer: Self-pay

## 2014-02-16 LAB — RPR

## 2014-02-16 LAB — ABO/RH: ABO/RH(D): A POS

## 2014-02-16 MED ORDER — PHENYLEPHRINE 40 MCG/ML (10ML) SYRINGE FOR IV PUSH (FOR BLOOD PRESSURE SUPPORT)
80.0000 ug | PREFILLED_SYRINGE | INTRAVENOUS | Status: DC | PRN
  Filled 2014-02-16: qty 10
  Filled 2014-02-16: qty 2

## 2014-02-16 MED ORDER — BUPIVACAINE HCL (PF) 0.25 % IJ SOLN
INTRAMUSCULAR | Status: DC | PRN
  Administered 2014-02-16: 3 mL
  Administered 2014-02-16: 5 mL

## 2014-02-16 MED ORDER — ZOLPIDEM TARTRATE 5 MG PO TABS
5.0000 mg | ORAL_TABLET | Freq: Once | ORAL | Status: AC
  Administered 2014-02-16: 5 mg via ORAL
  Filled 2014-02-16: qty 1

## 2014-02-16 MED ORDER — FENTANYL 2.5 MCG/ML BUPIVACAINE 1/10 % EPIDURAL INFUSION (WH - ANES)
14.0000 mL/h | INTRAMUSCULAR | Status: DC | PRN
  Administered 2014-02-16 (×2): 14 mL/h via EPIDURAL
  Filled 2014-02-16 (×2): qty 125

## 2014-02-16 MED ORDER — PHENYLEPHRINE 40 MCG/ML (10ML) SYRINGE FOR IV PUSH (FOR BLOOD PRESSURE SUPPORT)
80.0000 ug | PREFILLED_SYRINGE | INTRAVENOUS | Status: DC | PRN
  Filled 2014-02-16: qty 2

## 2014-02-16 MED ORDER — LIDOCAINE HCL (PF) 1 % IJ SOLN
INTRAMUSCULAR | Status: DC | PRN
  Administered 2014-02-16 (×2): 4 mL

## 2014-02-16 MED ORDER — DIPHENHYDRAMINE HCL 50 MG/ML IJ SOLN: 12.5000 mg | INTRAMUSCULAR | Status: DC | PRN

## 2014-02-16 MED ORDER — FENTANYL 2.5 MCG/ML BUPIVACAINE 1/10 % EPIDURAL INFUSION (WH - ANES)
INTRAMUSCULAR | Status: DC | PRN
  Administered 2014-02-16: 14 mL/h via EPIDURAL

## 2014-02-16 MED ORDER — EPHEDRINE 5 MG/ML INJ
10.0000 mg | INTRAVENOUS | Status: DC | PRN
  Filled 2014-02-16: qty 2

## 2014-02-16 MED ORDER — LACTATED RINGERS IV SOLN: 500.0000 mL | Freq: Once | INTRAVENOUS | Status: DC

## 2014-02-16 NOTE — Anesthesia Procedure Notes (Signed)
Epidural Patient location during procedure: OB Start time: 02/16/2014 12:15 PM End time: 02/16/2014 12:20 PM  Staffing Anesthesiologist: Milana Obey Performed by: anesthesiologist   Preanesthetic Checklist Completed: patient identified, site marked, surgical consent, pre-op evaluation, timeout performed, IV checked, risks and benefits discussed and monitors and equipment checked  Epidural Patient position: sitting Prep: site prepped and draped and DuraPrep Patient monitoring: continuous pulse ox and blood pressure Approach: midline Location: L3-L4 Injection technique: LOR saline  Needle:  Needle type: Tuohy  Needle gauge: 17 G Needle length: 9 cm and 9 Needle insertion depth: 5.5 cm Catheter type: closed end flexible Catheter size: 19 Gauge Catheter at skin depth: 10 cm Test dose: negative  Assessment Events: blood not aspirated, injection not painful, no injection resistance, negative IV test and no paresthesia  Additional Notes Patient identified. Risks/Benefits/Options discussed with patient including but not limited to bleeding, infection, nerve damage, paralysis, failed block, incomplete pain control, headache, blood pressure changes, nausea, vomiting, reactions to medication both or allergic, itching and postpartum back pain. Confirmed with bedside nurse the patient's most recent platelet count. Confirmed with patient that they are not currently taking any anticoagulation, have any bleeding history or any family history of bleeding disorders. Patient expressed understanding and wished to proceed. All questions were answered. Sterile technique was used throughout the entire procedure. Please see nursing notes for vital signs. Test dose was given through epidural catheter and negative prior to continuing to dose epidural or start infusion. Warning signs of high block given to the patient including shortness of breath, tingling/numbness in hands, complete motor block, or  any concerning symptoms with instructions to call for help. Patient was given instructions on fall risk and not to get out of bed. All questions and concerns addressed with instructions to call with any issues or inadequate analgesia.

## 2014-02-16 NOTE — Progress Notes (Signed)
Anna Martin is a 23 y.o. G1P0 at [redacted]w[redacted]d by LMP admitted for induction of labor due to Marginal CI.  Subjective: Crampy  Objective: BP 124/74 mmHg  Pulse 65  Temp(Src) 98.3 F (36.8 C) (Oral)  Resp 20  Ht 5\' 7"  (1.702 m)  Wt 74.39 kg (164 lb)  BMI 25.68 kg/m2      FHT:  FHR: 145 bpm, variability: moderate,  accelerations:  Present,  decelerations:  Absent UC:   regular, every 3 minutes SVE:   Dilation: 2.5 Effacement (%): 70 Station: -1 Exam by:: Dr Ronita Hipps AROM- clear  Labs: Lab Results  Component Value Date   WBC 9.5 02/15/2014   HGB 13.1 02/15/2014   HCT 36.4 02/15/2014   MCV 89.0 02/15/2014   PLT 184 02/15/2014    Assessment / Plan: Induction of labor due to marginal CI,  progressing well on pitocin  Labor: Progressing normally Preeclampsia:  no signs or symptoms of toxicity Fetal Wellbeing:  Category I Pain Control:  Labor support without medications I/D:  n/a Anticipated MOD:  NSVD  Jahayra Mazo J 02/16/2014, 11:14 AM

## 2014-02-16 NOTE — Progress Notes (Signed)
Anna Martin is a 23 y.o. G1P0 at [redacted]w[redacted]d by LMP admitted for induction of labor due to marginal CI.  Subjective: Getting uncomfortable  Objective: BP 117/65 mmHg  Pulse 84  Temp(Src) 98.3 F (36.8 C) (Oral)  Resp 18  Ht 5\' 7"  (1.702 m)  Wt 74.39 kg (164 lb)  BMI 25.68 kg/m2      FHT:  FHR: 155 bpm, variability: moderate,  accelerations:  Present,  decelerations:  Absent UC:   regular, every 3 minutes SVE:   Dilation: 2 Effacement (%): 40 Station: -2 Exam by:: s grindstaff rn  Labs: Lab Results  Component Value Date   WBC 9.5 02/15/2014   HGB 13.1 02/15/2014   HCT 36.4 02/15/2014   MCV 89.0 02/15/2014   PLT 184 02/15/2014    Assessment / Plan: Induction of labor due to marginal CI,  progressing well on pitocin  Labor: Progressing on Pitocin, will continue to increase then AROM Preeclampsia:  no signs or symptoms of toxicity Fetal Wellbeing:  Category I Pain Control:  Labor support without medications I/D:  n/a Anticipated MOD:  NSVD  Gavan Nordby J 02/16/2014, 9:15 AM

## 2014-02-16 NOTE — Progress Notes (Signed)
Anna Martin is a 23 y.o. G1P0 at [redacted]w[redacted]d by LMP admitted for induction of labor due to Marginal CI.  Subjective: comfortable  Objective: BP 111/56 mmHg  Pulse 57  Temp(Src) 98.9 F (37.2 C) (Oral)  Resp 16  Ht 5\' 7"  (1.702 m)  Wt 74.39 kg (164 lb)  BMI 25.68 kg/m2   Total I/O In: -  Out: 750 [Urine:750]  FHT:  FHR: 155 bpm, variability: moderate,  accelerations:  Present,  decelerations:  Absent UC:   irregular, every 1-4 minutes SVE:   Dilation: 7 Effacement (%): 90 Station: 0 Exam by:: s grindstaff rn  [id number] by IUPC  Labs: Lab Results  Component Value Date   WBC 9.5 02/15/2014   HGB 13.1 02/15/2014   HCT 36.4 02/15/2014   MCV 89.0 02/15/2014   PLT 184 02/15/2014    Assessment / Plan: Induction of labor due to Marginal CI,  progressing well on pitocin  Labor: Progressing normally despite dysfunctional pattern Preeclampsia:  no signs or symptoms of toxicity Fetal Wellbeing:  Category I Pain Control:  Epidural I/D:  n/a Anticipated MOD:  NSVD  Hernandez Losasso J 02/16/2014, 6:23 PM

## 2014-02-16 NOTE — Anesthesia Preprocedure Evaluation (Signed)
Anesthesia Evaluation  Patient identified by MRN, date of birth, ID band Patient awake    Reviewed: Allergy & Precautions, H&P , NPO status , Patient's Chart, lab work & pertinent test results  History of Anesthesia Complications Negative for: history of anesthetic complications  Airway Mallampati: II  TM Distance: >3 FB Neck ROM: Full    Dental no notable dental hx. (+) Dental Advisory Given   Pulmonary neg pulmonary ROS,  breath sounds clear to auscultation  Pulmonary exam normal       Cardiovascular Exercise Tolerance: Good + dysrhythmias (Hx of SVT years prior, worked up by cardiology in [place] per patient including an TTE which she reports was normal, was taking atenolol, has been off since before pregnancy and no repeated episodes) Rhythm:Regular Rate:Normal     Neuro/Psych negative neurological ROS  negative psych ROS   GI/Hepatic negative GI ROS, Neg liver ROS,   Endo/Other  negative endocrine ROS  Renal/GU negative Renal ROS  negative genitourinary   Musculoskeletal negative musculoskeletal ROS (+)   Abdominal   Peds negative pediatric ROS (+)  Hematology negative hematology ROS (+)   Anesthesia Other Findings   Reproductive/Obstetrics (+) Pregnancy                             Anesthesia Physical Anesthesia Plan  ASA: II  Anesthesia Plan: Epidural   Post-op Pain Management:    Induction:   Airway Management Planned:   Additional Equipment:   Intra-op Plan:   Post-operative Plan:   Informed Consent: I have reviewed the patients History and Physical, chart, labs and discussed the procedure including the risks, benefits and alternatives for the proposed anesthesia with the patient or authorized representative who has indicated his/her understanding and acceptance.     Plan Discussed with:   Anesthesia Plan Comments:         Anesthesia Quick  Evaluation

## 2014-02-16 NOTE — Progress Notes (Signed)
Anna Martin is a 23 y.o. G1P0 at [redacted]w[redacted]d by LMP admitted for induction of labor due to marginal CI.  Subjective: comfortable  Objective: BP 130/71 mmHg  Pulse 63  Temp(Src) 97.8 F (36.6 C) (Oral)  Resp 20  Ht 5\' 7"  (1.702 m)  Wt 74.39 kg (164 lb)  BMI 25.68 kg/m2      FHT:  FHR: 155 bpm, variability: moderate,  accelerations:  Present,  decelerations:  Absent UC:   regular, every 3 minutes SVE:   Dilation: 4 Effacement (%): 80 Station: -1 Exam by:: s grindstaff rn  IUPC placed without difficulty  Labs: Lab Results  Component Value Date   WBC 9.5 02/15/2014   HGB 13.1 02/15/2014   HCT 36.4 02/15/2014   MCV 89.0 02/15/2014   PLT 184 02/15/2014    Assessment / Plan: Induction of labor due to Marginal CI,  progressing well on pitocin  Labor: Progressing normally Preeclampsia:  no signs or symptoms of toxicity Fetal Wellbeing:  Category I Pain Control:  Epidural I/D:  n/a Anticipated MOD:  NSVD  Cathline Dowen J 02/16/2014, 4:01 PM

## 2014-02-17 ENCOUNTER — Encounter (HOSPITAL_COMMUNITY): Payer: Self-pay

## 2014-02-17 LAB — CBC
HEMATOCRIT: 35.8 % — AB (ref 36.0–46.0)
Hemoglobin: 12.5 g/dL (ref 12.0–15.0)
MCH: 31.4 pg (ref 26.0–34.0)
MCHC: 34.9 g/dL (ref 30.0–36.0)
MCV: 89.9 fL (ref 78.0–100.0)
PLATELETS: 185 10*3/uL (ref 150–400)
RBC: 3.98 MIL/uL (ref 3.87–5.11)
RDW: 12.5 % (ref 11.5–15.5)
WBC: 15.9 10*3/uL — ABNORMAL HIGH (ref 4.0–10.5)

## 2014-02-17 MED ORDER — ONDANSETRON HCL 4 MG/2ML IJ SOLN: 4.0000 mg | INTRAMUSCULAR | Status: DC | PRN

## 2014-02-17 MED ORDER — METHYLERGONOVINE MALEATE 0.2 MG PO TABS: 0.2000 mg | ORAL_TABLET | ORAL | Status: DC | PRN

## 2014-02-17 MED ORDER — OXYCODONE-ACETAMINOPHEN 5-325 MG PO TABS: 2.0000 | ORAL_TABLET | ORAL | Status: DC | PRN

## 2014-02-17 MED ORDER — DIPHENHYDRAMINE HCL 25 MG PO CAPS: 25.0000 mg | ORAL_CAPSULE | Freq: Four times a day (QID) | ORAL | Status: DC | PRN

## 2014-02-17 MED ORDER — ZOLPIDEM TARTRATE 5 MG PO TABS: 5.0000 mg | ORAL_TABLET | Freq: Every evening | ORAL | Status: DC | PRN

## 2014-02-17 MED ORDER — SENNOSIDES-DOCUSATE SODIUM 8.6-50 MG PO TABS
2.0000 | ORAL_TABLET | ORAL | Status: DC
  Filled 2014-02-17 (×2): qty 2

## 2014-02-17 MED ORDER — ONDANSETRON HCL 4 MG PO TABS: 4.0000 mg | ORAL_TABLET | ORAL | Status: DC | PRN

## 2014-02-17 MED ORDER — OXYCODONE-ACETAMINOPHEN 5-325 MG PO TABS: 1.0000 | ORAL_TABLET | ORAL | Status: DC | PRN

## 2014-02-17 MED ORDER — SIMETHICONE 80 MG PO CHEW: 80.0000 mg | CHEWABLE_TABLET | ORAL | Status: DC | PRN

## 2014-02-17 MED ORDER — WITCH HAZEL-GLYCERIN EX PADS: 1.0000 "application " | MEDICATED_PAD | CUTANEOUS | Status: DC | PRN

## 2014-02-17 MED ORDER — BENZOCAINE-MENTHOL 20-0.5 % EX AERO
1.0000 "application " | INHALATION_SPRAY | CUTANEOUS | Status: DC | PRN
  Administered 2014-02-17: 1 via TOPICAL
  Filled 2014-02-17: qty 56

## 2014-02-17 MED ORDER — METHYLERGONOVINE MALEATE 0.2 MG/ML IJ SOLN: 0.2000 mg | INTRAMUSCULAR | Status: DC | PRN

## 2014-02-17 MED ORDER — IBUPROFEN 600 MG PO TABS
600.0000 mg | ORAL_TABLET | Freq: Four times a day (QID) | ORAL | Status: DC
  Administered 2014-02-17 – 2014-02-18 (×6): 600 mg via ORAL
  Filled 2014-02-17 (×6): qty 1

## 2014-02-17 MED ORDER — DIBUCAINE 1 % RE OINT
1.0000 | TOPICAL_OINTMENT | RECTAL | Status: DC | PRN
Start: 2014-02-17 — End: 2014-02-18

## 2014-02-17 MED ORDER — PRENATAL MULTIVITAMIN CH
1.0000 | ORAL_TABLET | Freq: Every day | ORAL | Status: DC
  Filled 2014-02-17: qty 1

## 2014-02-17 MED ORDER — LANOLIN HYDROUS EX OINT: TOPICAL_OINTMENT | CUTANEOUS | Status: DC | PRN

## 2014-02-17 MED ORDER — TETANUS-DIPHTH-ACELL PERTUSSIS 5-2.5-18.5 LF-MCG/0.5 IM SUSP: 0.5000 mL | Freq: Once | INTRAMUSCULAR | Status: DC

## 2014-02-17 NOTE — Lactation Note (Signed)
This note was copied from the chart of Anna Martin. Lactation Consultation Note  Mom asked for assistance with BF.  Baby is sound asleep and skin to skin with his mother. He was not interested in eating at all.  Encouraged mom to try and again and for her to call out when baby is 5-6 hours post circumcision and waking up. Patient Name: Anna Martin Today's Date: 02/17/2014 Reason for consult: Initial assessment   Maternal Data Does the patient have breastfeeding experience prior to this delivery?: No  Feeding Feeding Type: Breast Fed  LATCH Score/Interventions Latch: Too sleepy or reluctant, no latch achieved, no sucking elicited.  Audible Swallowing: None  Type of Nipple: Everted at rest and after stimulation  Comfort (Breast/Nipple): Soft / non-tender     Hold (Positioning): No assistance needed to correctly position infant at breast.  LATCH Score: 6  Lactation Tools Discussed/Used     Consult Status Consult Status: Follow-up Date: 02/17/14 Follow-up type: In-patient    Van Clines 02/17/2014, 2:48 PM

## 2014-02-17 NOTE — Progress Notes (Signed)
Patient ID: Anna Martin, female   DOB: 1990/12/07, 23 y.o.   MRN: [id number] PPD # 1 SVD  S:  Reports feeling well             Tolerating po/ No nausea or vomiting             Bleeding is light             Pain controlled with ibuprofen (OTC)             Up ad lib / ambulatory / voiding without difficulties    Newborn  Information for the patient's newborn:  Dea, Bitting [[id number]]  female  breast feeding  / Circumcision in progress   O:  A & O x 3, in no apparent distress              VS:  Filed Vitals:   02/16/14 2248 02/16/14 2336 02/17/14 0105 02/17/14 0502  BP: 118/72 116/61 113/66 112/59  Pulse: 116 89 87 52  Temp:  98.3 F (36.8 C) 98.2 F (36.8 C) 98.2 F (36.8 C)  TempSrc:  Oral Oral Oral  Resp: 18 18 18 18   Height:      Weight:        LABS:  Recent Labs  02/15/14 2030 02/17/14 0545  WBC 9.5 15.9*  HGB 13.1 12.5  HCT 36.4 35.8*  PLT 184 185    Blood type: A POS (12/26 2030)  Rubella: Immune (06/03 0000)   I&O: I/O last 3 completed shifts: In: 605.5 [I.V.:605.5] Out: 2150 [Urine:1850; Blood:300]             Lungs: Clear and unlabored  Heart: regular rate and rhythm / no murmurs  Abdomen: soft, non-tender, non-distended              Fundus: firm, non-tender, U-2  Perineum: 2nd degree repair healing well, no edema - ice pack in place  Lochia: minimql  Extremities: no edema, no calf pain or tenderness, no Homans    A/P: PPD # 1  23 y.o., [id number]   Principal Problem:   Postpartum care following vaginal delivery (12/27) Active Problems:   Term pregnancy   Doing well - stable status  Routine post partum orders  Anticipate discharge tomorrow    Laury Deep, M, MSN, CNM 02/17/2014, 9:14 AM

## 2014-02-17 NOTE — Anesthesia Postprocedure Evaluation (Signed)
Anesthesia Post Note  Patient: Anna Martin  Procedure(s) Performed: * No procedures listed *  Anesthesia type: Epidural  Patient location: Mother/Baby  Post pain: Pain level controlled  Post assessment: Post-op Vital signs reviewed  Last Vitals:  Filed Vitals:   02/17/14 0502  BP: 112/59  Pulse: 52  Temp: 36.8 C  Resp: 18    Post vital signs: Reviewed  Level of consciousness:alert  Complications: No apparent anesthesia complications

## 2014-02-17 NOTE — Lactation Note (Signed)
This note was copied from the chart of Boy Emorie Stefanko. Lactation Consultation Note  Patient Name: Boy Simrah Chatham Today's Date: 02/17/2014 Reason for consult: Initial assessment Baby recently circumcised and asleep at this visit. Mom reports she hand expressed this morning receiving few drops of colostrum and baby had a good feeding. Basic teaching reviewed with Mom. Lactation brochure left for review. Advised of OP services and support group. Mom to call for LC to observe latch with next feeding, Mom reports she would like LC to observe latch.  Advised Mom baby may be sleepy due to circumcision today. If not waking to breastfeed Mom may need to hand express and spoon feed till baby waking to nurse. Call for assist.   Maternal Data Does the patient have breastfeeding experience prior to this delivery?: No  Feeding Feeding Type: Breast Fed Length of feed: 30 min  LATCH Score/Interventions                      Lactation Tools Discussed/Used     Consult Status Consult Status: Follow-up Date: 02/17/14 Follow-up type: In-patient    Katrine Coho 02/17/2014, 11:22 AM

## 2014-02-18 MED ORDER — IBUPROFEN 600 MG PO TABS: 600.0000 mg | ORAL_TABLET | Freq: Four times a day (QID) | ORAL | Status: DC

## 2014-02-18 MED ORDER — INFLUENZA VAC SPLIT QUAD 0.5 ML IM SUSY: 0.5000 mL | PREFILLED_SYRINGE | INTRAMUSCULAR | Status: DC

## 2014-02-18 NOTE — Progress Notes (Signed)
PPD #2- SVD  Subjective:   Reports feeling well, ready for discharge Tolerating po/ No nausea or vomiting Bleeding is light Pain controlled with Motrin Up ad lib / ambulatory / voiding without problems Newborn: breastfeeding  / Circumcision: done   Objective:   VS: VS:  Filed Vitals:   02/17/14 0105 02/17/14 0502 02/17/14 1706 02/18/14 0635  BP: 113/66 112/59 118/72 105/61  Pulse: 87 52 77 64  Temp: 98.2 F (36.8 C) 98.2 F (36.8 C) 98 F (36.7 C) 98.3 F (36.8 C)  TempSrc: Oral Oral Oral Oral  Resp: 18 18 18 18   Height:      Weight:        LABS:  Recent Labs  02/15/14 2030 02/17/14 0545  WBC 9.5 15.9*  HGB 13.1 12.5  PLT 184 185   Blood type: --/--/A POS, A POS (12/26 2030) Rubella: Immune (06/03 0000)                I&O: Intake/Output      12/28 0701 - 12/29 0700 12/29 0701 - 12/30 0700   I.V. (mL/kg)     Total Intake(mL/kg)     Urine (mL/kg/hr)     Blood     Total Output       Net              Physical Exam: Alert and oriented X3 Abdomen: soft, non-tender, non-distended  Fundus: firm, non-tender, U-2 Perineum: Well approximated, no significant erythema, edema, or drainage; healing well. Lochia: small Extremities: No edema, no calf pain or tenderness    Assessment: PPD #2  G2P1011/ S/P:induced vaginal, 2nd degree laceration Doing well - stable for discharge home   Plan: Discharge home RX's:  Ibuprofen 600mg  po Q 6 hrs prn pain #30 Refill x 0 Routine pp visit in [place] given    Julianne Handler, N MSN, CNM 02/18/2014, 9:45 AM

## 2014-02-18 NOTE — Lactation Note (Signed)
This note was copied from the chart of Anna Martin. Lactation Consultation Note  Patient Name: Anna Martin Today's Date: 02/18/2014 Reason for consult: Follow-up assessment Baby 37 hours of life. Baby nursing when [facility] entered room in cross-cradle position, on nipple deeply, suckling rhythmically with intermittent swallows noted. Mom denies any nipple pain. Mom reports baby woke up and cluster-fed last night. Mom states that baby is latching well. Mom referred to Baby and Me booklet for number of diapers to expect by day of life and EBM storage guidelines. Discussed returning to work and nursing/pumping. Reviewed engorgement prevention/treatment. Mom aware of OP/BFSG and [place] phone line assistance after D/C.   Maternal Data    Feeding Feeding Type:  (Baby nursing when [facility] entered room.)  [facility] Score/Interventions                      Lactation Tools Discussed/Used     Consult Status Consult Status: Complete    Inocente Salles 02/18/2014, 10:24 AM

## 2014-02-18 NOTE — Discharge Summary (Signed)
Obstetric Discharge Summary Reason for Admission: induction of labor, polyhydramnios, marginal CI Prenatal Procedures: ultrasound Intrapartum Procedures: spontaneous vaginal delivery Postpartum Procedures: none Complications-Operative and Postpartum: 2nd degree perineal laceration HEMOGLOBIN  Date Value Ref Range Status  02/17/2014 12.5 12.0 - 15.0 g/dL Final   HCT  Date Value Ref Range Status  02/17/2014 35.8* 36.0 - 46.0 % Final    Physical Exam:  General: alert and cooperative Lochia: appropriate Uterine Fundus: firm Incision: healing well, no significant drainage, no dehiscence, no significant erythema DVT Evaluation: No evidence of DVT seen on physical exam. Negative Homan's sign. No cords or calf tenderness. No significant calf/ankle edema.  Discharge Diagnoses: Term Pregnancy-delivered  Discharge Information: Date: 02/18/2014 Activity: pelvic rest Diet: routine Medications: PNV and Ibuprofen Condition: stable Instructions: refer to practice specific booklet Discharge to: home Follow-up Information    Follow up with Lovenia Kim, MD. Schedule an appointment as soon as possible for a visit in 6 weeks.   Specialty:  Obstetrics and Gynecology   Contact information:   [place] [address] [phone]       Newborn Data: Live born female on 02/16/14 Birth Weight: 8 lb 12.7 oz (3990 g) APGAR: 8, 9  Home with mother.  Thaer Miyoshi, [place], N 02/18/2014, 11:12 AM

## 2014-03-11 ENCOUNTER — Ambulatory Visit (HOSPITAL_COMMUNITY)
Admission: RE | Admit: 2014-03-11 | Discharge: 2014-03-11 | Disposition: A | Discharge status: Home / Self Care | Payer: 59 | Source: Ambulatory Visit | Attending: Obstetrics and Gynecology | Admitting: Obstetrics and Gynecology

## 2014-03-11 NOTE — Lactation Note (Signed)
Lactation Consult  Mother's reason for visit:  Mom here for feeding assessment. Baby has become fussy at the breast. Clydell Hakim now 36 weeks old.  Visit Type:  Outpatient Appointment Notes:  Mom reports baby was nursing well till about 1 week ago when baby starting becoming fussy at the breast (pulling on/off, become red in the face, grunting, groaning). The past 3-4 days this has become worse where baby get so frantic he cannot latch. During the day he is wanting to BF every 1 - 1 1/2 hours for 10 minutes. Mom feels actually suckling is about 5 minutes. Mom breasts fill between feedings. She is concerned about forceful let down.  Consult:  Initial Lactation Consultant:  Katrine Coho  ________________________________________________________________________   Sharene Skeans Name: Roe Coombs Date of Birth: 02/16/2016 Pediatrician: Dr. Burt Knack Gender: female Gestational Age: [redacted]w[redacted]d (At Birth) Birth Weight: 8 lb 12.7 oz (3990 g) Weight at Discharge: Weight: 8 lb 8.3 oz (3865 g)Date of Discharge: 02/18/2014 [facility] Weights   02/16/14 2034 02/18/14 0008  Weight: 8 lb 12.7 oz (3990 g) 8 lb 8.3 oz (3865 g)   Last weight taken from location outside of [facility]: Today 03/11/14 - 11lb 11.0 oz Location:Pediatrician's office Weight today: 11 lb. 9.8 oz/5268 gm  ________________________________________________________________________  Mother's Name: Arlita Mullan Type of delivery:  SVB Breastfeeding Experience:  P1 Maternal Medical Conditions:  Denies History Maternal Medications:  PNV  ________________________________________________________________________  Breastfeeding History (Post Discharge)  Frequency of breastfeeding:  Every 1 - 1 1/2 during the day, every 2 hours at night Duration of feeding:  approx 10 minutes  Patient does not supplement or pump.  Infant Intake and Output Assessment  Voids:  10-12 in 24 hrs.  Color:  Clear  yellow Stools:  10 in 24 hrs.  Color:  Yellow, will occasionally have some green in stool  ________________________________________________________________________  Maternal Breast Assessment  Breast:  Full Nipple:  Erect Pain level:  0 Pain interventions:  N/A  _______________________________________________________________________ Feeding Assessment/Evaluation  Initial feeding assessment:  Infant's oral assessment:  Variance. Short frenulum.  Positioning:  Cross cradle Right breast  LATCH documentation:  Latch:  2 = Grasps breast easily, tongue down, lips flanged, rhythmical sucking.  Audible swallowing:  2 = Spontaneous and intermittent  Type of nipple:  2 = Everted at rest and after stimulation  Comfort (Breast/Nipple):  2 = Soft / non-tender  Hold (Positioning):  2 = No assistance needed to correctly position infant at breast  LATCH score:  10  Attached assessment:  Deep  Lips flanged:  No. Bottom lip needed to be un-tucked. Mom reminded to keep baby close while nursing.   Lips untucked:  Yes.    Suck assessment:  Nutritive  Tools:  N/A Instructed on use and cleaning of tool:  N/A  Pre-feed weight:  5268 g  (11 lb. 9.8 oz.) Post-feed weight:  5380 g (11 lb. 13.8 oz.) Amount transferred:  112 ml. Baby initially latched on the right breast in cross cradle. Baby latched without difficulty demonstrating a good rhythmic suck with noted swallows. Did not pull on/off the breast with the latch, however, LC had Mom hand express prior to latching baby. Baby became sleepy after 5 minutes, breast softening. Mom took baby off the breast, burped baby then Mom re-latched to right breast in football hold. Baby had difficulty latching again to right breast with nipple/aerola and breast more soft. Baby pulling back and crying. LC demonstrated how to sandwich nipple/aerola then baby was able to  latch and sustain a good suckling pattern. After another 5 minutes baby came off the breast  satiated, Mom's breast was soft.  Amount supplemented:  0 ml  No  Total amount pumped post feed:  R 0 ml    L  0 ml  Mom did not pump at this visit.   Total amount transferred:  112 ml  > 3 1/2 oz this feeding.  Total supplement given:  0 ml  Discussed with Mom pre-pumping or using hand expression thru 1st milk ejection to help slow the flow of milk with nursing for baby.  Advised if baby fussy in 1 position try a different position to help with latch. Discussed growth spurts.  Baby is able to empty the breast well in 10 minutes and may only breastfeed 1 breast per feeding, after burping baby offer the 2nd breast to try to keep baby from nursing so frequently during the day, however if satiated don't force baby to take 2nd breast.  Mom had questions about pumping to return to work. LC advised if baby BF on 1 breast then pump and store the breast milk from the other breast. Alternate each feeding which breast the baby BF from and which breast she pumps. If baby takes both breast, post pump for 10-15 minutes few times/day for storage.  Do not pump at night.  Baby does appear to be gassy with feedings and Mom reports he spits up occasionally. Advised to keep baby upright for 15-20 minutes after feeding to see if this helps. Discussed diet and gas producing foods.  If baby continues to be gassy/spitty, consider having frenulum evaluated by Peds as this make affect seal resulting in baby having more gas at the breast.

## 2014-05-02 ENCOUNTER — Encounter: Payer: Self-pay | Admitting: *Deleted

## 2014-05-02 ENCOUNTER — Emergency Department
Admission: EM | Admit: 2014-05-02 | Discharge: 2014-05-02 | Disposition: A | Discharge status: Home / Self Care | Payer: 59 | Source: Home / Self Care | Attending: Emergency Medicine | Admitting: Emergency Medicine

## 2014-05-02 DIAGNOSIS — J042 Acute laryngotracheitis: Secondary | ICD-10-CM | POA: Diagnosis not present

## 2014-05-02 DIAGNOSIS — J029 Acute pharyngitis, unspecified: Secondary | ICD-10-CM

## 2014-05-02 LAB — POCT RAPID STREP A (OFFICE): Rapid Strep A Screen: NEGATIVE

## 2014-05-02 MED ORDER — AZITHROMYCIN 250 MG PO TABS: ORAL_TABLET | ORAL | Status: DC

## 2014-05-02 NOTE — ED Notes (Signed)
Pt c/o sore throat, hoarseness, HA and sore lymph nodes in her neck x 4 days. Denies fever. She is currently breastfeeding.

## 2014-05-03 NOTE — ED Provider Notes (Signed)
CSN: [id number]     Arrival date & time 05/02/14  1040 History   First MD Initiated Contact with Patient 05/02/14 1046     Chief Complaint  Patient presents with  . Sore Throat   (Consider location/radiation/quality/duration/timing/severity/associated sxs/prior Treatment) HPI Pt c/o sore throat, hoarseness, HA and sore lymph nodes in her neck,and cough x 4 days. Progressively worsening. Low-grade fever. She is currently breastfeeding, baby is 2 1/2 months old. Has been using over-the-counter Motrin which helps a little bit.  +  Nasal congestion No Discolored Post-nasal drainage No sinus pain/pressure + sore throat  +  Nonproductive cough No wheezing mild chest congestion No hemoptysis No shortness of breath No pleuritic pain  No itchy/red eyes No earache  No nausea No vomiting No abdominal pain No diarrhea  No skin rashes +  Fatigue No myalgias + mild, nonfocal headache   Past Medical History  Diagnosis Date  . Acute right flank pain 01/15/2014  . Marginal insertion of umbilical cord 57/84/6962  . Tachycardia     not followed by cardiology at this time   Past Surgical History  Procedure Laterality Date  . No past surgeries     Family History  Problem Relation Age of Onset  . Parkinsonism Mother   . Hypertension Maternal Grandmother   . Hypertension Maternal Grandfather    History  Substance Use Topics  . Smoking status: Never Smoker   . Smokeless tobacco: Not on file  . Alcohol Use: No   OB History    Gravida Para Term Preterm AB TAB SAB Ectopic Multiple Living   2 1 1  1  1   0 1     Review of Systems  All other systems reviewed and are negative.   Allergies  Review of patient's allergies indicates no known allergies.  Home Medications   Prior to Admission medications   Medication Sig Start Date End Date Taking? Authorizing Provider  UNKNOWN TO PATIENT    Yes Historical Provider, MD  azithromycin (ZITHROMAX Z-PAK) 250 MG tablet Take 2  tablets on day one, then 1 tablet daily on days 2 through 5 05/02/14   Jacqulyn Cane, MD  ibuprofen (ADVIL,MOTRIN) 600 MG tablet Take 1 tablet (600 mg total) by mouth every 6 (six) hours. 02/18/14   Graciela Husbands, CNM  Prenatal Vit-Fe Fumarate-FA (PRENATAL MULTIVITAMIN) TABS tablet Take 1 tablet by mouth at bedtime.    Historical Provider, MD   BP 97/66 mmHg  Pulse 86  Temp(Src) 98.8 F (37.1 C) (Oral)  Resp 16  Ht 5\' 7"  (1.702 m)  Wt 129 lb (58.514 kg)  BMI 20.20 kg/m2  SpO2 99% Physical Exam  Constitutional: She is oriented to person, place, and time. She appears well-developed and well-nourished. She is cooperative.  Non-toxic appearance. No distress.  HENT:  Head: Normocephalic and atraumatic.  Right Ear: Tympanic membrane, external ear and ear canal normal.  Left Ear: Tympanic membrane, external ear and ear canal normal.  Nose: Nose normal. Right sinus exhibits no maxillary sinus tenderness and no frontal sinus tenderness. Left sinus exhibits no maxillary sinus tenderness and no frontal sinus tenderness.  Mouth/Throat: Mucous membranes are normal. Posterior oropharyngeal erythema present. No oropharyngeal exudate or posterior oropharyngeal edema.  Eyes: Conjunctivae are normal. No scleral icterus.  Neck: Neck supple.  Cardiovascular: Normal rate, regular rhythm and normal heart sounds.   No murmur heard. Pulmonary/Chest: Effort normal. No stridor. No respiratory distress. She has no wheezes. She has no rales.  Hoarse. Lungs are  clear except few anterior rhonchi  Musculoskeletal: She exhibits no edema.  Lymphadenopathy:    She has cervical adenopathy.       Right cervical: Superficial cervical adenopathy present. No deep cervical and no posterior cervical adenopathy present.      Left cervical: Superficial cervical adenopathy present. No deep cervical and no posterior cervical adenopathy present.  Neurological: She is alert and oriented to person, place, and time.  Skin: Skin  is warm and dry.  Psychiatric: She has a normal mood and affect.  Nursing note and vitals reviewed.   ED Course  Procedures (including critical care time) Labs Review Labs Reviewed  POCT RAPID STREP A (OFFICE)   Results for orders placed or performed during the hospital encounter of 05/02/14  POCT rapid strep A  Result Value Ref Range   Rapid Strep A Screen Negative Negative     Imaging Review No results found.   MDM   1. Acute pharyngitis, unspecified pharyngitis type   2. Acute laryngitis and tracheitis    Treatment options discussed, as well as risks, benefits, alternatives. Patient voiced understanding and agreement with the following plans: Discharge Medication List as of 05/02/2014 11:25 AM    START taking these medications   Details  azithromycin (ZITHROMAX Z-PAK) 250 MG tablet Take 2 tablets on day one, then 1 tablet daily on days 2 through 5, Normal       Follow-up with your primary care doctor in 5-7 days if not improving, or sooner if symptoms become worse. Precautions discussed. Red flags discussed. Questions invited and answered. Patient voiced understanding and agreement.     Jacqulyn Cane, MD 05/03/14 [phone]

## 2015-04-08 DIAGNOSIS — Z01419 Encounter for gynecological examination (general) (routine) without abnormal findings: Secondary | ICD-10-CM | POA: Diagnosis not present

## 2015-05-15 ENCOUNTER — Encounter: Payer: Self-pay | Admitting: Physician Assistant

## 2015-05-15 ENCOUNTER — Ambulatory Visit: Payer: Self-pay | Admitting: Physician Assistant

## 2015-05-15 VITALS — BP 90/60 | HR 80 | Temp 98.4°F

## 2015-05-15 DIAGNOSIS — J029 Acute pharyngitis, unspecified: Secondary | ICD-10-CM

## 2015-05-15 MED ORDER — AZITHROMYCIN 500 MG PO TABS: 500.0000 mg | ORAL_TABLET | Freq: Every day | ORAL | Status: DC

## 2015-05-15 NOTE — Progress Notes (Signed)
   Subjective:    Patient ID: Anna Martin, female    DOB: 1990/07/10, 25 y.o.   MRN: [id number]  HPI Patient c/o sore throat and tender cervical lymph nodes for 3 days. Denies other URI s/s.   Review of Systems Breastfeeding    Objective:   Physical Exam No acute distress, VSS. HEENT for erythematous but non-exudate tonsils. Bilateral cervical adenopathy, but neck is supple. Lungs CTA and Heart RRR.       Assessment & Plan: Pharyngitis  Zithromax as directed and follow up in 3 days if not improvement.

## 2015-11-06 ENCOUNTER — Encounter: Payer: Self-pay | Admitting: Physician Assistant

## 2015-11-06 ENCOUNTER — Ambulatory Visit: Payer: Self-pay | Admitting: Physician Assistant

## 2015-11-06 VITALS — BP 102/70 | Temp 98.4°F

## 2015-11-06 DIAGNOSIS — J0100 Acute maxillary sinusitis, unspecified: Secondary | ICD-10-CM

## 2015-11-06 MED ORDER — AMOXICILLIN 875 MG PO TABS: 875.0000 mg | ORAL_TABLET | Freq: Two times a day (BID) | ORAL | 0 refills | Status: DC

## 2015-11-06 NOTE — Progress Notes (Signed)
S: C/o runny nose and congestion for 3 days, no fever, chills, cp/sob, v/d; mucus is green and thick, cough is sporadic, c/o of facial and dental pain some ear pain and pressure, taking otc prenatal but is not pregnant  Using otc meds: mucinex  O: PE: perrl eomi, normocephalic, tms dull, nasal mucosa red and swollen, throat injected, neck supple no lymph, lungs c t a, cv rrr, neuro intact  A:  Acute sinusitis   P: drink fluids, continue regular meds , use otc meds of choice, return if not improving in 5 days, return earlier if worsening , amoxil 875mg  bid x 10d

## 2016-03-03 ENCOUNTER — Ambulatory Visit: Payer: Self-pay | Admitting: Physician Assistant

## 2016-03-03 ENCOUNTER — Encounter: Payer: Self-pay | Admitting: Physician Assistant

## 2016-03-03 VITALS — BP 100/70 | HR 112 | Temp 98.9°F

## 2016-03-03 DIAGNOSIS — R509 Fever, unspecified: Secondary | ICD-10-CM

## 2016-03-03 LAB — POCT INFLUENZA A/B
Influenza A, POC: NEGATIVE
Influenza B, POC: NEGATIVE

## 2016-03-03 MED ORDER — AMOXICILLIN-POT CLAVULANATE 875-125 MG PO TABS: 1.0000 | ORAL_TABLET | Freq: Two times a day (BID) | ORAL | 0 refills | Status: DC

## 2016-03-03 NOTE — Progress Notes (Signed)
S: C/o runny nose and congestion for 3 days, no fever, chills, cp/sob, v/d; mucus is green and thick, cough is sporadic, c/o of facial and dental pain. Ear pain, 26 y/o child has been sick with the same, concerned about flu  Using otc meds:   O: PE: vitals wnl, nad, perrl eomi, normocephalic, tms dull, r has reddishe tint with fluid level; nasal mucosa red and swollen, throat injected, neck supple no lymph, lungs c t a, cv rrr, neuro intact, flu swab neg  A:  Acute sinusitis,aom   P: drink fluids, continue regular meds , use otc meds of choice, return if not improving in 5 days, return earlier if worsening , augmentin 875mg  bid x 10d,

## 2016-03-06 IMAGING — US US RENAL
1 series · 13 of 24 positions shown · non-contrast
Comparison: None.

CLINICAL DATA: Acute onset right flank pain

EXAM:
RENAL/URINARY TRACT ULTRASOUND COMPLETE

[Series 1: us renal · 13 of 24 slices shown]
[im 1/24]
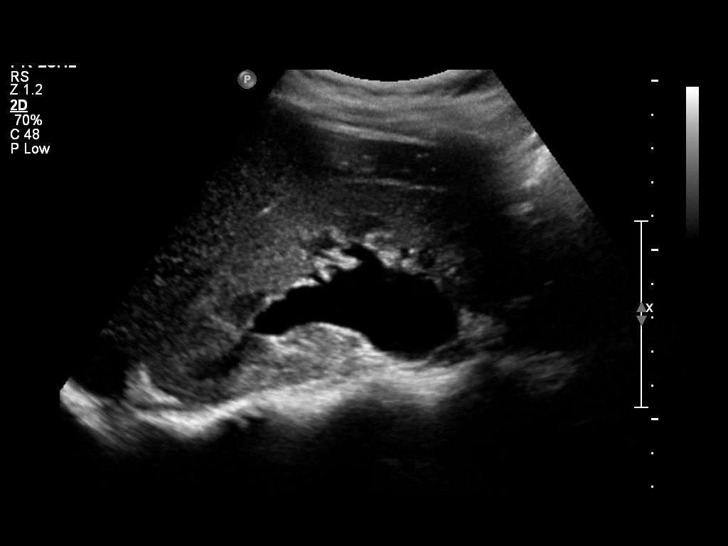
[im 3/24]
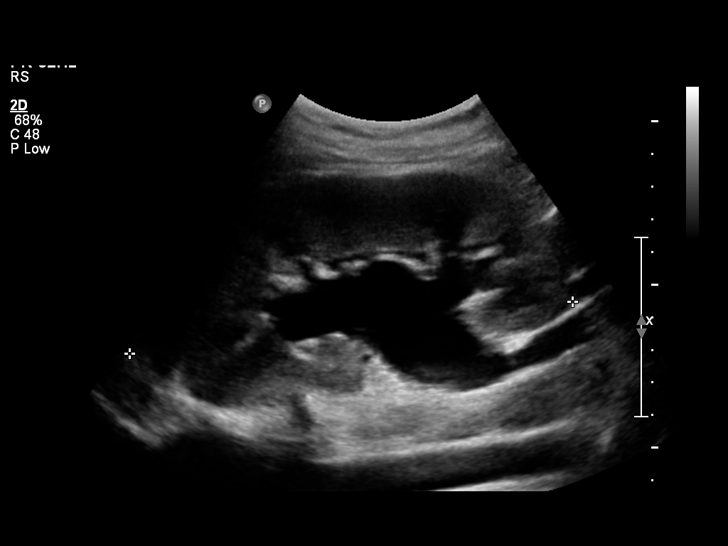
[im 5/24]
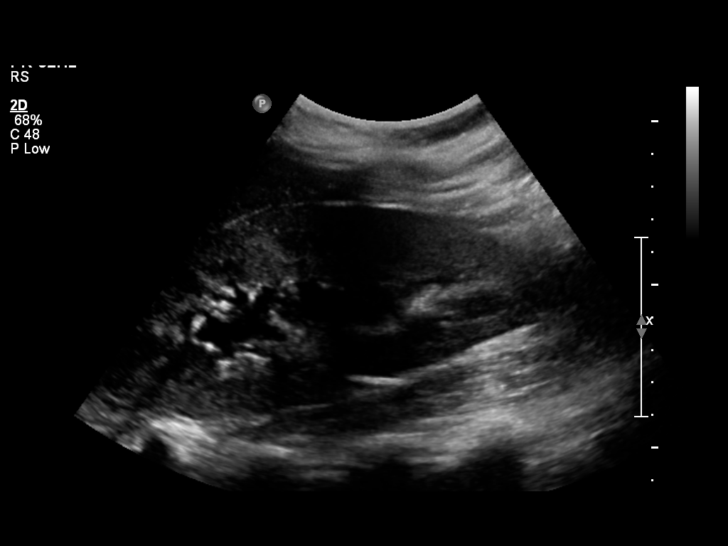
[im 7/24]
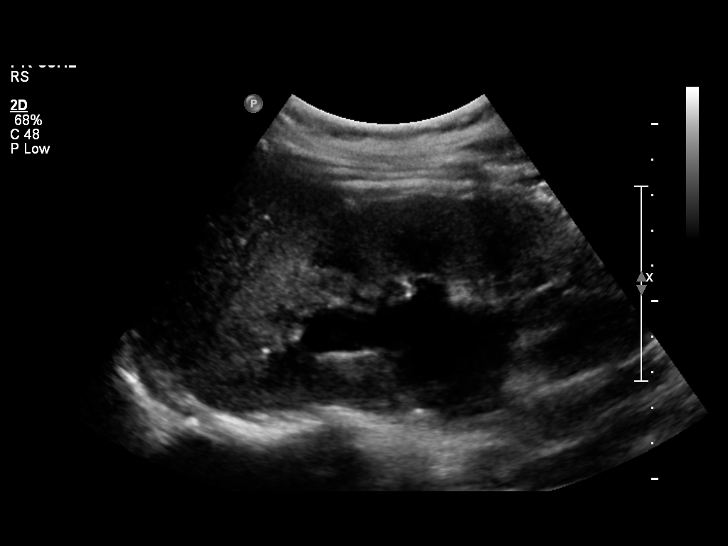
[im 9/24]
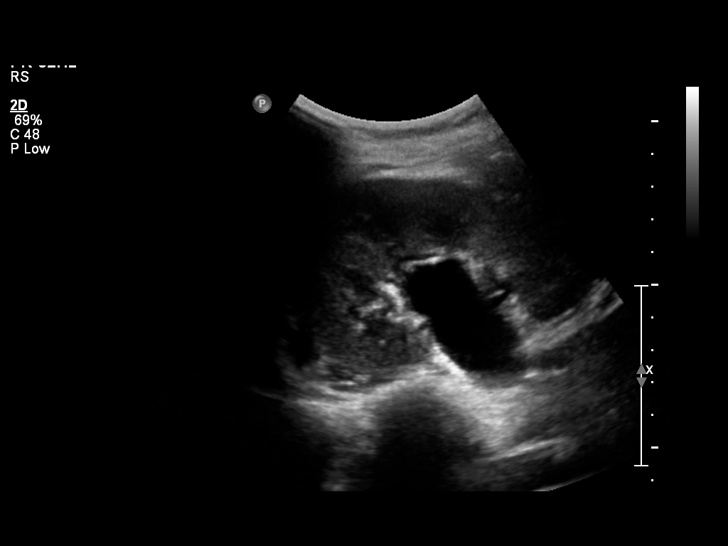
[im 11/24]
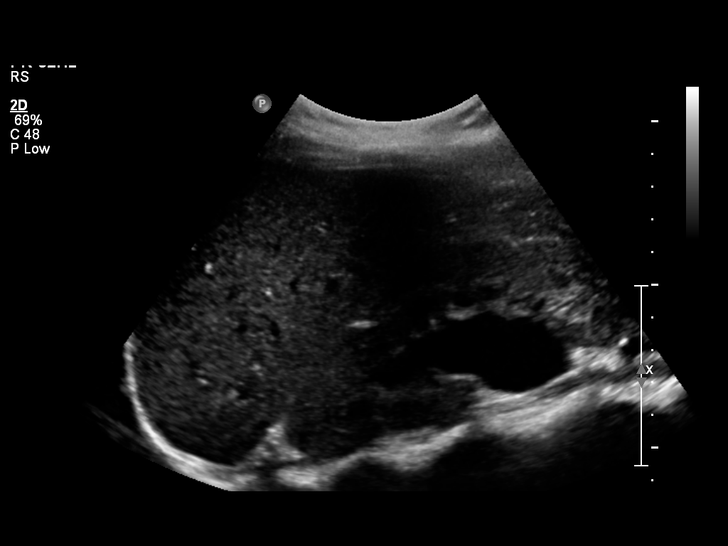
[im 13/24]
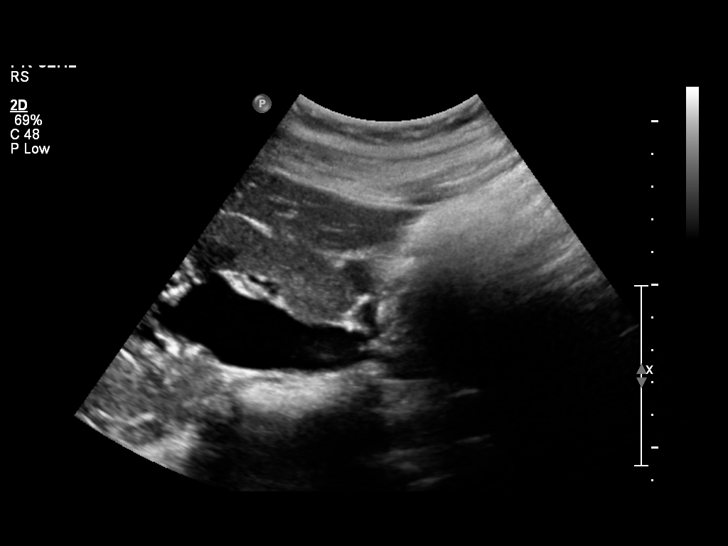
[im 14/24]
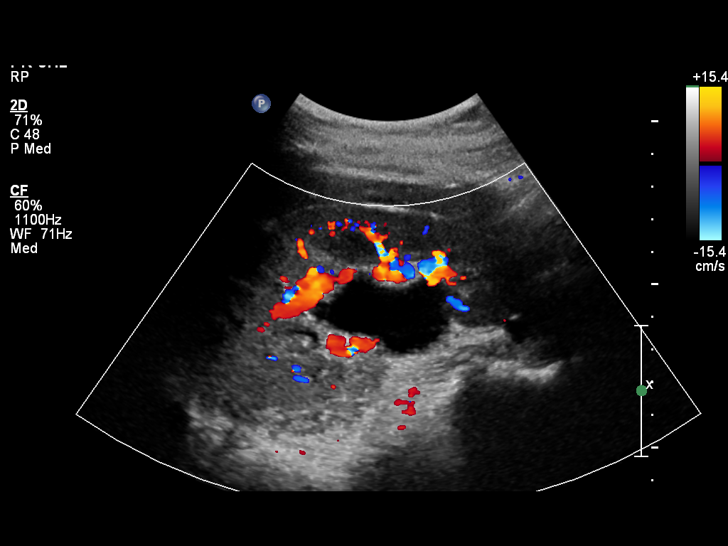
[im 16/24]
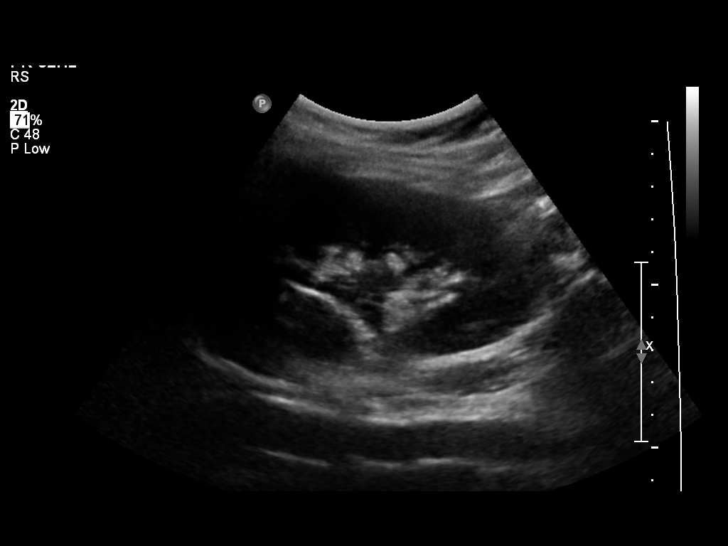
[im 18/24]
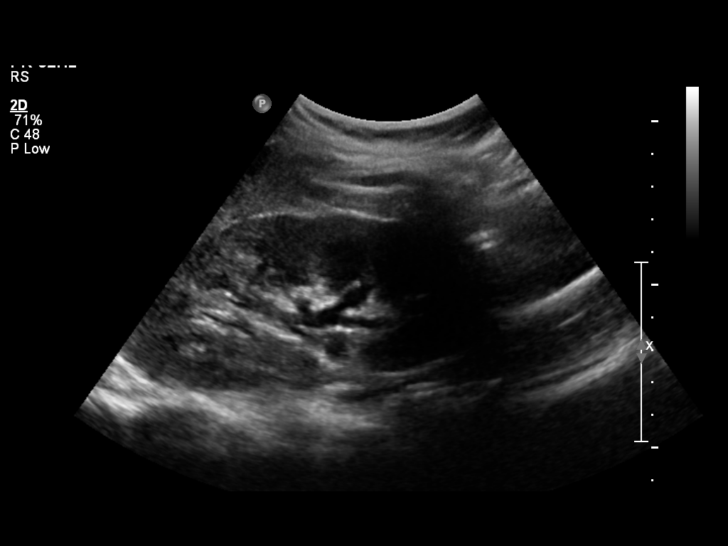
[im 20/24]
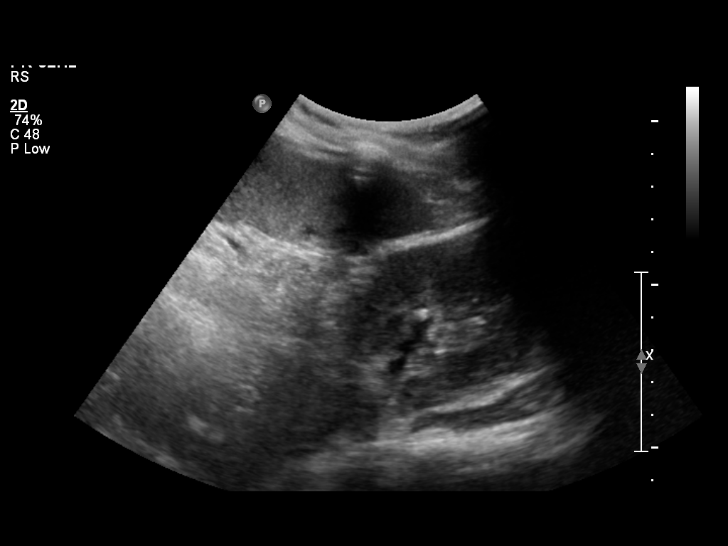
[im 22/24]
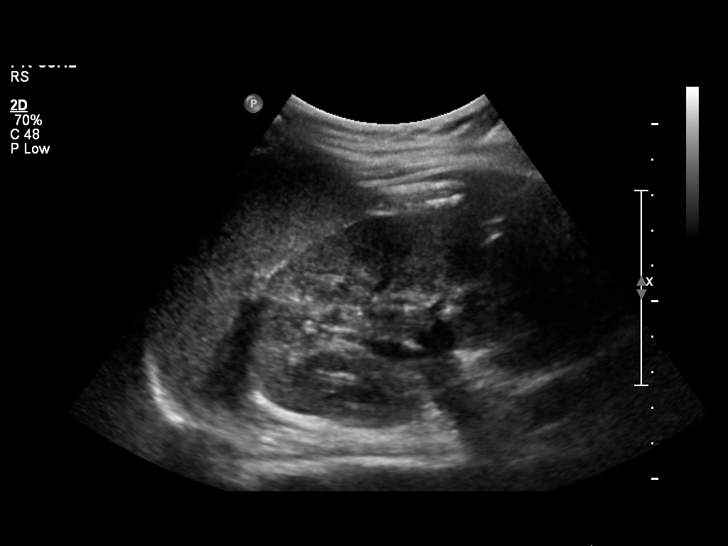
[im 24/24]
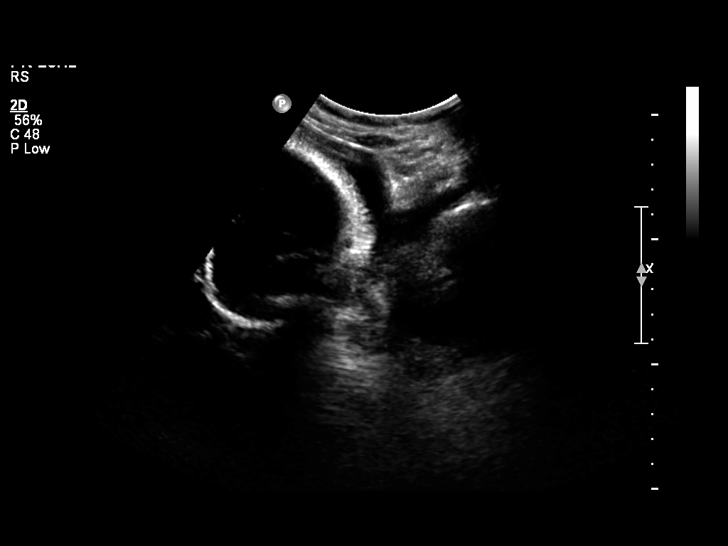

[13 of 24 positions shown; findings below may reference images not displayed]

FINDINGS: Right Kidney:

Length: 14.3 cm. Echogenicity and renal cortical thickness are
within normal limits. No mass or perinephric fluid visualized. There
is moderate hydronephrosis and proximal ureterectasis on the right.
No calculus is appreciable in the right kidney or visualize proximal
right ureter.

Left Kidney:

Length: 12.6 cm. Echogenicity and renal cortical thickness are
within normal limits. No mass or perinephric fluid visualized. There
is minimal pelvicaliectasis on the left. No sonographically
demonstrable calculus or ureterectasis.

Bladder:

Appears normal for degree of bladder distention. There is a third
trimester intrauterine gestation causing impression on the urinary
bladder.
IMPRESSION: Moderate hydronephrosis and ureterectasis on the right. This finding
is concerning for potential distal ureteral obstruction. Minimal
fullness of the left renal collecting system may be due to the
intrauterine gestation causing impression on the left ureter. There
is no mass. There is no visualized intrarenal or proximal right
ureteral calculus. Study otherwise unremarkable.

## 2016-04-11 DIAGNOSIS — N926 Irregular menstruation, unspecified: Secondary | ICD-10-CM | POA: Diagnosis not present

## 2016-04-11 DIAGNOSIS — Z681 Body mass index (BMI) 19 or less, adult: Secondary | ICD-10-CM | POA: Diagnosis not present

## 2016-04-11 DIAGNOSIS — Z01419 Encounter for gynecological examination (general) (routine) without abnormal findings: Secondary | ICD-10-CM | POA: Diagnosis not present

## 2016-04-11 DIAGNOSIS — Z3169 Encounter for other general counseling and advice on procreation: Secondary | ICD-10-CM | POA: Diagnosis not present

## 2016-04-29 ENCOUNTER — Ambulatory Visit: Payer: Self-pay | Admitting: Physician Assistant

## 2016-04-29 ENCOUNTER — Encounter: Payer: Self-pay | Admitting: Physician Assistant

## 2016-04-29 VITALS — BP 100/60 | HR 82 | Temp 98.5°F

## 2016-04-29 DIAGNOSIS — J0100 Acute maxillary sinusitis, unspecified: Secondary | ICD-10-CM

## 2016-04-29 MED ORDER — FLUTICASONE PROPIONATE 50 MCG/ACT NA SUSP: 2.0000 | Freq: Every day | NASAL | 6 refills | Status: DC

## 2016-04-29 MED ORDER — CEFDINIR 300 MG PO CAPS: 300.0000 mg | ORAL_CAPSULE | Freq: Two times a day (BID) | ORAL | 0 refills | Status: DC

## 2016-04-29 MED ORDER — PREDNISONE 10 MG PO TABS: 30.0000 mg | ORAL_TABLET | Freq: Every day | ORAL | 0 refills | Status: DC

## 2016-04-29 NOTE — Progress Notes (Signed)
S: C/o runny nose and congestion for 3 days, ear pain and pressure,  no fever, chills, cp/sob, v/d; mucus is green and thick, cough is sporadic, c/o of facial and dental pain.   Using otc meds:   O: PE: vitals wnl, nad, perrl eomi, normocephalic, tms dull, nasal mucosa red and swollen, throat injected, neck supple no lymph, lungs c t a, cv rrr, neuro intact  A:  Acute sinusitis   P: drink fluids, continue regular meds , use otc meds of choice, return if not improving in 5 days, return earlier if worsening, omnicef, prednisone 30mg  qd x 3d, flonase

## 2016-07-06 DIAGNOSIS — Z711 Person with feared health complaint in whom no diagnosis is made: Secondary | ICD-10-CM | POA: Diagnosis not present

## 2016-07-06 DIAGNOSIS — N6459 Other signs and symptoms in breast: Secondary | ICD-10-CM | POA: Diagnosis not present

## 2016-10-27 ENCOUNTER — Other Ambulatory Visit
Admission: RE | Admit: 2016-10-27 | Discharge: 2016-10-27 | Disposition: A | Discharge status: Home / Self Care | Payer: 59 | Source: Ambulatory Visit | Attending: Obstetrics & Gynecology | Admitting: Obstetrics & Gynecology

## 2016-10-27 DIAGNOSIS — N926 Irregular menstruation, unspecified: Secondary | ICD-10-CM | POA: Insufficient documentation

## 2016-10-28 ENCOUNTER — Other Ambulatory Visit: Payer: Self-pay | Admitting: Obstetrics & Gynecology

## 2016-10-28 LAB — PROGESTERONE: PROGESTERONE: 25.4 ng/mL

## 2016-11-07 MED FILL — LETROZOLE 2.5 MG TABLET: 2.5 | 5 days supply | Qty: 10 | Fill #0

## 2016-11-24 ENCOUNTER — Other Ambulatory Visit
Admission: RE | Admit: 2016-11-24 | Discharge: 2016-11-24 | Disposition: A | Discharge status: Home / Self Care | Payer: 59 | Source: Ambulatory Visit | Attending: Obstetrics & Gynecology | Admitting: Obstetrics & Gynecology

## 2016-11-24 DIAGNOSIS — N926 Irregular menstruation, unspecified: Secondary | ICD-10-CM | POA: Insufficient documentation

## 2016-11-25 LAB — PROGESTERONE: Progesterone: 20.9 ng/mL

## 2016-12-06 MED FILL — LETROZOLE 2.5 MG TABLET: 2.5 | 5 days supply | Qty: 10 | Fill #0

## 2016-12-29 ENCOUNTER — Ambulatory Visit: Payer: Self-pay | Admitting: Physician Assistant

## 2016-12-29 ENCOUNTER — Encounter: Payer: Self-pay | Admitting: Physician Assistant

## 2016-12-29 VITALS — BP 90/70 | HR 103 | Temp 97.7°F

## 2016-12-29 DIAGNOSIS — N912 Amenorrhea, unspecified: Secondary | ICD-10-CM

## 2016-12-29 DIAGNOSIS — J0100 Acute maxillary sinusitis, unspecified: Secondary | ICD-10-CM

## 2016-12-29 LAB — POCT URINE PREGNANCY: Preg Test, Ur: NEGATIVE

## 2016-12-29 MED ORDER — AZITHROMYCIN 250 MG PO TABS: ORAL_TABLET | ORAL | 0 refills | Status: DC

## 2016-12-29 NOTE — Progress Notes (Signed)
S: C/o runny nose and congestion for 7 days, no fever, chills, cp/sob, v/d; mucus is green and thick, cough is sporadic, c/o of facial and dental pain. Entire household has been sick, is trying to get pregnant so did not take any otc meds  Using otc meds:   O: PE: vitals wnl, nad, perrl eomi, normocephalic, tms dull, nasal mucosa red and swollen, throat injected, neck supple no lymph, lungs c t a, cv rrr, neuro intact  A:  Acute sinusitis   P: drink fluids, continue regular meds , use otc meds of choice, return if not improving in 5 days, return earlier if worsening , zpack

## 2017-02-06 DIAGNOSIS — Z8742 Personal history of other diseases of the female genital tract: Secondary | ICD-10-CM | POA: Diagnosis not present

## 2017-02-08 DIAGNOSIS — O039 Complete or unspecified spontaneous abortion without complication: Secondary | ICD-10-CM | POA: Diagnosis not present

## 2017-02-08 DIAGNOSIS — R3915 Urgency of urination: Secondary | ICD-10-CM | POA: Diagnosis not present

## 2017-02-08 DIAGNOSIS — Z8742 Personal history of other diseases of the female genital tract: Secondary | ICD-10-CM | POA: Diagnosis not present

## 2017-02-08 MED FILL — LETROZOLE 2.5 MG TABLET: 2.5 | 5 days supply | Qty: 10 | Fill #0

## 2017-02-21 NOTE — L&D Delivery Note (Signed)
Delivery Note At 2:55 PM a viable and healthy female was delivered via Vaginal, Spontaneous (Presentation:OA ).  APGAR: 8, 9; weight 8 lb 4.8 oz (3765 g).   Placenta status: spontaneous and complete. Then a piece palpated in lower segment, 5-6 cm size cotyledon removed, possible sucsenturiate lobe. Uterus explored, small clots removed.  Cord: Nuchal x1, released over head. 3 vessel cord.  Cord pH: N/A  Anesthesia:  Epidural  Episiotomy: None Lacerations: None Est. Blood Loss (mL): 719  Bleeding continued to ooze slowly from cervical os, exploration of upper and lower segment done and clots removed. Uterine lower segment seems irregular inside where the sucsenturiate lobe might have been.  Cytotec 1000 mcg placed rectally Bleeding continued as a trickle, so TXA dose IV given. Bedside sono called- No retained tissue noted, possible small clot in lower segment, it was removed by manual exploration.  By now bleeding was minimal.  Pt informed and understands.   Mom to postpartum.  Baby to Couplet care / Skin to Skin.  Elveria Royals 02/07/2018, 4:44 PM

## 2017-03-06 MED FILL — LETROZOLE 2.5 MG TABLET: 2.5 | 5 days supply | Qty: 10 | Fill #0

## 2017-04-10 MED FILL — LETROZOLE 2.5 MG TABLET: 2.5 | 5 days supply | Qty: 10 | Fill #0

## 2017-04-24 DIAGNOSIS — N898 Other specified noninflammatory disorders of vagina: Secondary | ICD-10-CM | POA: Diagnosis not present

## 2017-04-24 DIAGNOSIS — N760 Acute vaginitis: Secondary | ICD-10-CM | POA: Diagnosis not present

## 2017-04-24 DIAGNOSIS — Z3169 Encounter for other general counseling and advice on procreation: Secondary | ICD-10-CM | POA: Diagnosis not present

## 2017-04-24 DIAGNOSIS — Z01419 Encounter for gynecological examination (general) (routine) without abnormal findings: Secondary | ICD-10-CM | POA: Diagnosis not present

## 2017-04-24 DIAGNOSIS — Z681 Body mass index (BMI) 19 or less, adult: Secondary | ICD-10-CM | POA: Diagnosis not present

## 2017-04-24 MED FILL — metroNIDAZOLE 500 MG TABS: 500 | 7 days supply | Qty: 14 | Fill #0

## 2017-04-28 DIAGNOSIS — Z8742 Personal history of other diseases of the female genital tract: Secondary | ICD-10-CM | POA: Diagnosis not present

## 2017-05-22 DIAGNOSIS — N979 Female infertility, unspecified: Secondary | ICD-10-CM | POA: Diagnosis not present

## 2017-06-06 DIAGNOSIS — Z319 Encounter for procreative management, unspecified: Secondary | ICD-10-CM | POA: Diagnosis not present

## 2017-06-06 DIAGNOSIS — Z8742 Personal history of other diseases of the female genital tract: Secondary | ICD-10-CM | POA: Diagnosis not present

## 2017-06-06 DIAGNOSIS — Z3201 Encounter for pregnancy test, result positive: Secondary | ICD-10-CM | POA: Diagnosis not present

## 2017-06-08 DIAGNOSIS — O09291 Supervision of pregnancy with other poor reproductive or obstetric history, first trimester: Secondary | ICD-10-CM | POA: Diagnosis not present

## 2017-06-08 DIAGNOSIS — Z3A01 Less than 8 weeks gestation of pregnancy: Secondary | ICD-10-CM | POA: Diagnosis not present

## 2017-06-14 DIAGNOSIS — Z3A01 Less than 8 weeks gestation of pregnancy: Secondary | ICD-10-CM | POA: Diagnosis not present

## 2017-06-14 DIAGNOSIS — O09291 Supervision of pregnancy with other poor reproductive or obstetric history, first trimester: Secondary | ICD-10-CM | POA: Diagnosis not present

## 2017-06-19 DIAGNOSIS — O09291 Supervision of pregnancy with other poor reproductive or obstetric history, first trimester: Secondary | ICD-10-CM | POA: Diagnosis not present

## 2017-06-19 DIAGNOSIS — Z3A01 Less than 8 weeks gestation of pregnancy: Secondary | ICD-10-CM | POA: Diagnosis not present

## 2017-06-27 DIAGNOSIS — Z3201 Encounter for pregnancy test, result positive: Secondary | ICD-10-CM | POA: Diagnosis not present

## 2017-07-24 DIAGNOSIS — Z3491 Encounter for supervision of normal pregnancy, unspecified, first trimester: Secondary | ICD-10-CM | POA: Diagnosis not present

## 2017-07-24 DIAGNOSIS — Z3481 Encounter for supervision of other normal pregnancy, first trimester: Secondary | ICD-10-CM | POA: Diagnosis not present

## 2017-07-24 DIAGNOSIS — Z36 Encounter for antenatal screening for chromosomal anomalies: Secondary | ICD-10-CM | POA: Diagnosis not present

## 2017-07-24 DIAGNOSIS — Z3689 Encounter for other specified antenatal screening: Secondary | ICD-10-CM | POA: Diagnosis not present

## 2017-07-24 LAB — OB RESULTS CONSOLE GC/CHLAMYDIA
Chlamydia: NEGATIVE
Gonorrhea: NEGATIVE

## 2017-07-24 LAB — OB RESULTS CONSOLE RUBELLA ANTIBODY, IGM: Rubella: IMMUNE

## 2017-07-24 LAB — OB RESULTS CONSOLE HEPATITIS B SURFACE ANTIGEN: Hepatitis B Surface Ag: NEGATIVE

## 2017-07-24 LAB — OB RESULTS CONSOLE ANTIBODY SCREEN: Antibody Screen: NEGATIVE

## 2017-07-24 LAB — OB RESULTS CONSOLE ABO/RH: RH TYPE: POSITIVE

## 2017-07-24 LAB — OB RESULTS CONSOLE HIV ANTIBODY (ROUTINE TESTING): HIV: NONREACTIVE

## 2017-07-24 LAB — OB RESULTS CONSOLE RPR: RPR: NONREACTIVE

## 2017-08-09 DIAGNOSIS — Z3481 Encounter for supervision of other normal pregnancy, first trimester: Secondary | ICD-10-CM | POA: Diagnosis not present

## 2017-08-09 DIAGNOSIS — Z3682 Encounter for antenatal screening for nuchal translucency: Secondary | ICD-10-CM | POA: Diagnosis not present

## 2017-08-30 DIAGNOSIS — Z361 Encounter for antenatal screening for raised alphafetoprotein level: Secondary | ICD-10-CM | POA: Diagnosis not present

## 2017-09-20 DIAGNOSIS — Z369 Encounter for antenatal screening, unspecified: Secondary | ICD-10-CM | POA: Diagnosis not present

## 2017-10-18 DIAGNOSIS — O09292 Supervision of pregnancy with other poor reproductive or obstetric history, second trimester: Secondary | ICD-10-CM | POA: Diagnosis not present

## 2017-10-18 DIAGNOSIS — Z3A23 23 weeks gestation of pregnancy: Secondary | ICD-10-CM | POA: Diagnosis not present

## 2017-11-21 DIAGNOSIS — Z3689 Encounter for other specified antenatal screening: Secondary | ICD-10-CM | POA: Diagnosis not present

## 2017-12-20 DIAGNOSIS — Z3689 Encounter for other specified antenatal screening: Secondary | ICD-10-CM | POA: Diagnosis not present

## 2017-12-20 DIAGNOSIS — Z23 Encounter for immunization: Secondary | ICD-10-CM | POA: Diagnosis not present

## 2017-12-20 DIAGNOSIS — O4403 Placenta previa specified as without hemorrhage, third trimester: Secondary | ICD-10-CM | POA: Diagnosis not present

## 2017-12-20 DIAGNOSIS — Z3A32 32 weeks gestation of pregnancy: Secondary | ICD-10-CM | POA: Diagnosis not present

## 2018-01-15 DIAGNOSIS — Z3483 Encounter for supervision of other normal pregnancy, third trimester: Secondary | ICD-10-CM | POA: Diagnosis not present

## 2018-01-15 DIAGNOSIS — Z3685 Encounter for antenatal screening for Streptococcus B: Secondary | ICD-10-CM | POA: Diagnosis not present

## 2018-01-15 LAB — OB RESULTS CONSOLE GBS: GBS: NEGATIVE

## 2018-01-30 ENCOUNTER — Encounter (HOSPITAL_COMMUNITY): Payer: Self-pay | Admitting: *Deleted

## 2018-01-30 ENCOUNTER — Telehealth (HOSPITAL_COMMUNITY): Payer: Self-pay | Admitting: *Deleted

## 2018-01-30 NOTE — Telephone Encounter (Signed)
Preadmission screen  

## 2018-01-31 ENCOUNTER — Telehealth (HOSPITAL_COMMUNITY): Payer: Self-pay | Admitting: *Deleted

## 2018-01-31 NOTE — Telephone Encounter (Signed)
Preadmission screen  

## 2018-02-01 ENCOUNTER — Other Ambulatory Visit: Payer: Self-pay | Admitting: Obstetrics & Gynecology

## 2018-02-07 ENCOUNTER — Inpatient Hospital Stay (HOSPITAL_COMMUNITY): Payer: 59

## 2018-02-07 ENCOUNTER — Encounter (HOSPITAL_COMMUNITY): Payer: Self-pay

## 2018-02-07 ENCOUNTER — Inpatient Hospital Stay (HOSPITAL_COMMUNITY)
Admission: RE | Admit: 2018-02-07 | Discharge: 2018-02-08 | DRG: 806 | Disposition: A | Discharge status: Home / Self Care | Payer: 59 | Attending: Obstetrics & Gynecology | Admitting: Obstetrics & Gynecology

## 2018-02-07 ENCOUNTER — Inpatient Hospital Stay (HOSPITAL_COMMUNITY): Payer: 59 | Admitting: Anesthesiology

## 2018-02-07 ENCOUNTER — Other Ambulatory Visit: Payer: Self-pay

## 2018-02-07 DIAGNOSIS — Z3490 Encounter for supervision of normal pregnancy, unspecified, unspecified trimester: Secondary | ICD-10-CM | POA: Diagnosis present

## 2018-02-07 DIAGNOSIS — Z3483 Encounter for supervision of other normal pregnancy, third trimester: Secondary | ICD-10-CM | POA: Diagnosis present

## 2018-02-07 DIAGNOSIS — Z3A39 39 weeks gestation of pregnancy: Secondary | ICD-10-CM

## 2018-02-07 LAB — CBC
HEMATOCRIT: 39.2 % (ref 36.0–46.0)
Hemoglobin: 13.2 g/dL (ref 12.0–15.0)
MCH: 30.6 pg (ref 26.0–34.0)
MCHC: 33.7 g/dL (ref 30.0–36.0)
MCV: 90.7 fL (ref 80.0–100.0)
Platelets: 185 10*3/uL (ref 150–400)
RBC: 4.32 MIL/uL (ref 3.87–5.11)
RDW: 13.2 % (ref 11.5–15.5)
WBC: 11.1 10*3/uL — ABNORMAL HIGH (ref 4.0–10.5)
nRBC: 0 % (ref 0.0–0.2)

## 2018-02-07 LAB — TYPE AND SCREEN
ABO/RH(D): A POS
Antibody Screen: NEGATIVE

## 2018-02-07 LAB — RPR: RPR Ser Ql: NONREACTIVE

## 2018-02-07 MED ORDER — DIPHENHYDRAMINE HCL 25 MG PO CAPS: 25.0000 mg | ORAL_CAPSULE | Freq: Four times a day (QID) | ORAL | Status: DC | PRN

## 2018-02-07 MED ORDER — OXYTOCIN 40 UNITS IN LACTATED RINGERS INFUSION - SIMPLE MED: 2.5000 [IU]/h | INTRAVENOUS | Status: DC

## 2018-02-07 MED ORDER — DIBUCAINE 1 % RE OINT: 1.0000 "application " | TOPICAL_OINTMENT | RECTAL | Status: DC | PRN

## 2018-02-07 MED ORDER — ACETAMINOPHEN 325 MG PO TABS: 650.0000 mg | ORAL_TABLET | ORAL | Status: DC | PRN

## 2018-02-07 MED ORDER — ONDANSETRON HCL 4 MG/2ML IJ SOLN: 4.0000 mg | INTRAMUSCULAR | Status: DC | PRN

## 2018-02-07 MED ORDER — EPHEDRINE 5 MG/ML INJ
10.0000 mg | INTRAVENOUS | Status: DC | PRN
  Filled 2018-02-07: qty 2

## 2018-02-07 MED ORDER — SENNOSIDES-DOCUSATE SODIUM 8.6-50 MG PO TABS
2.0000 | ORAL_TABLET | ORAL | Status: DC
  Administered 2018-02-07: 2 via ORAL
  Filled 2018-02-07: qty 2

## 2018-02-07 MED ORDER — ONDANSETRON HCL 4 MG PO TABS: 4.0000 mg | ORAL_TABLET | ORAL | Status: DC | PRN

## 2018-02-07 MED ORDER — TERBUTALINE SULFATE 1 MG/ML IJ SOLN
0.2500 mg | Freq: Once | INTRAMUSCULAR | Status: DC | PRN
  Filled 2018-02-07: qty 1

## 2018-02-07 MED ORDER — TETANUS-DIPHTH-ACELL PERTUSSIS 5-2.5-18.5 LF-MCG/0.5 IM SUSP: 0.5000 mL | Freq: Once | INTRAMUSCULAR | Status: DC

## 2018-02-07 MED ORDER — LACTATED RINGERS IV SOLN
500.0000 mL | INTRAVENOUS | Status: DC | PRN
  Administered 2018-02-07: 1000 mL via INTRAVENOUS

## 2018-02-07 MED ORDER — OXYTOCIN 40 UNITS IN LACTATED RINGERS INFUSION - SIMPLE MED
1.0000 m[IU]/min | INTRAVENOUS | Status: DC
  Administered 2018-02-07: 2 m[IU]/min via INTRAVENOUS
  Filled 2018-02-07: qty 1000

## 2018-02-07 MED ORDER — LACTATED RINGERS IV SOLN
INTRAVENOUS | Status: DC
  Administered 2018-02-07 (×2): via INTRAVENOUS

## 2018-02-07 MED ORDER — MISOPROSTOL 25 MCG QUARTER TABLET
25.0000 ug | ORAL_TABLET | ORAL | Status: DC | PRN
  Administered 2018-02-07: 25 ug via VAGINAL
  Filled 2018-02-07 (×3): qty 1

## 2018-02-07 MED ORDER — LIDOCAINE HCL (PF) 1 % IJ SOLN
30.0000 mL | INTRAMUSCULAR | Status: DC | PRN
  Filled 2018-02-07: qty 30

## 2018-02-07 MED ORDER — MISOPROSTOL 200 MCG PO TABS
ORAL_TABLET | ORAL | Status: AC
  Filled 2018-02-07: qty 5

## 2018-02-07 MED ORDER — WITCH HAZEL-GLYCERIN EX PADS: 1.0000 "application " | MEDICATED_PAD | CUTANEOUS | Status: DC | PRN

## 2018-02-07 MED ORDER — LACTATED RINGERS IV SOLN
500.0000 mL | Freq: Once | INTRAVENOUS | Status: AC
  Administered 2018-02-07: 10:00:00 via INTRAVENOUS

## 2018-02-07 MED ORDER — COCONUT OIL OIL
1.0000 "application " | TOPICAL_OIL | Status: DC | PRN
  Administered 2018-02-08: 1 via TOPICAL
  Filled 2018-02-07: qty 120

## 2018-02-07 MED ORDER — LIDOCAINE HCL (PF) 1 % IJ SOLN
INTRAMUSCULAR | Status: DC | PRN
  Administered 2018-02-07 (×2): 4 mL via EPIDURAL

## 2018-02-07 MED ORDER — LACTATED RINGERS IV SOLN: 500.0000 mL | Freq: Once | INTRAVENOUS | Status: DC

## 2018-02-07 MED ORDER — DIPHENHYDRAMINE HCL 50 MG/ML IJ SOLN: 12.5000 mg | INTRAMUSCULAR | Status: DC | PRN

## 2018-02-07 MED ORDER — ONDANSETRON HCL 4 MG/2ML IJ SOLN: 4.0000 mg | Freq: Four times a day (QID) | INTRAMUSCULAR | Status: DC | PRN

## 2018-02-07 MED ORDER — SIMETHICONE 80 MG PO CHEW: 80.0000 mg | CHEWABLE_TABLET | ORAL | Status: DC | PRN

## 2018-02-07 MED ORDER — FENTANYL 2.5 MCG/ML BUPIVACAINE 1/10 % EPIDURAL INFUSION (WH - ANES)
14.0000 mL/h | INTRAMUSCULAR | Status: DC | PRN
  Administered 2018-02-07: 14 mL/h via EPIDURAL
  Filled 2018-02-07: qty 100

## 2018-02-07 MED ORDER — BENZOCAINE-MENTHOL 20-0.5 % EX AERO: 1.0000 "application " | INHALATION_SPRAY | CUTANEOUS | Status: DC | PRN

## 2018-02-07 MED ORDER — SOD CITRATE-CITRIC ACID 500-334 MG/5ML PO SOLN: 30.0000 mL | ORAL | Status: DC | PRN

## 2018-02-07 MED ORDER — OXYTOCIN BOLUS FROM INFUSION
500.0000 mL | Freq: Once | INTRAVENOUS | Status: AC
  Administered 2018-02-07: 500 mL via INTRAVENOUS

## 2018-02-07 MED ORDER — TRANEXAMIC ACID-NACL 1000-0.7 MG/100ML-% IV SOLN: 1000.0000 mg | Freq: Once | INTRAVENOUS | Status: DC

## 2018-02-07 MED ORDER — ZOLPIDEM TARTRATE 5 MG PO TABS: 5.0000 mg | ORAL_TABLET | Freq: Every evening | ORAL | Status: DC | PRN

## 2018-02-07 MED ORDER — PHENYLEPHRINE 40 MCG/ML (10ML) SYRINGE FOR IV PUSH (FOR BLOOD PRESSURE SUPPORT)
80.0000 ug | PREFILLED_SYRINGE | INTRAVENOUS | Status: DC | PRN
  Filled 2018-02-07: qty 10

## 2018-02-07 MED ORDER — IBUPROFEN 600 MG PO TABS
600.0000 mg | ORAL_TABLET | Freq: Four times a day (QID) | ORAL | Status: DC
  Administered 2018-02-07 – 2018-02-08 (×5): 600 mg via ORAL
  Filled 2018-02-07 (×4): qty 1

## 2018-02-07 MED ORDER — MISOPROSTOL 200 MCG PO TABS
800.0000 ug | ORAL_TABLET | Freq: Once | ORAL | Status: AC
  Administered 2018-02-07: 800 ug via RECTAL

## 2018-02-07 MED ORDER — TRANEXAMIC ACID-NACL 1000-0.7 MG/100ML-% IV SOLN
INTRAVENOUS | Status: AC
  Administered 2018-02-07: 1000 mg
  Filled 2018-02-07: qty 100

## 2018-02-07 MED ORDER — PHENYLEPHRINE 40 MCG/ML (10ML) SYRINGE FOR IV PUSH (FOR BLOOD PRESSURE SUPPORT)
80.0000 ug | PREFILLED_SYRINGE | INTRAVENOUS | Status: DC | PRN
  Filled 2018-02-07 (×2): qty 10

## 2018-02-07 MED ORDER — PRENATAL MULTIVITAMIN CH
1.0000 | ORAL_TABLET | Freq: Every day | ORAL | Status: DC
  Filled 2018-02-07: qty 1

## 2018-02-07 NOTE — H&P (Addendum)
Shlonda E Balestrieri is a 27 y.o. female G3P1011, 39 wks by 1st trim sono, admitted for labor IOL, maternal request.  Femara conception (8th cycle), Prometrium and bbASA in 1st trimester. After that uncomplicated pregnancy, PNCare from 8 wks, [place] [facility]. Nl growth and placenta at 32 wks (low placenta resolved) Prior term SVD. 8'12"   OB History    Gravida  3   Para  1   Term  1   Preterm      AB  1   Living  1     SAB  1   TAB      Ectopic      Multiple  0   Live Births  1          Past Medical History:  Diagnosis Date  . Acute right flank pain 01/15/2014  . Headache   . Marginal insertion of umbilical cord 16/11/9602  . Tachycardia    not followed by cardiology at this time   Past Surgical History:  Procedure Laterality Date  . NO PAST SURGERIES     Family History: family history includes Diabetes in her maternal grandfather; Heart disease in her paternal grandfather; Hypertension in her maternal grandfather and maternal grandmother; Parkinsonism in her maternal grandmother. Social History:  reports that she has never smoked. She has never used smokeless tobacco. She reports that she does not drink alcohol or use drugs.     Maternal Diabetes: No Genetic Screening: Normal Maternal Ultrasounds/Referrals: Normal Fetal Ultrasounds or other Referrals:  None Maternal Substance Abuse:  No Significant Maternal Medications:  Prometrium and bbASA in 1st trim Significant Maternal Lab Results:  Lab values include: Group B Strep negative Other Comments:  None  ROS neg History Dilation: 2 Effacement (%): 60 Station: -2 Exam by:: Beverlee Nims Ansah-Mens[place], rnc  Blood pressure (!) 103/55, pulse 97, temperature 98.3 F (36.8 C), temperature source Oral, resp. rate 17, height 5\' 7"  (1.702 m), weight 74.4 kg, unknown if currently breastfeeding. Exam Physical Exam  Physical exam:  A&O x 3, no acute distress. Pleasant HEENT neg, no thyromegaly Lungs CTA bilat CV RRR, S1S2  normal Abdo soft, non tender, non acute Extr no edema/ tenderness Pelvic above FHT  140s + accels no decels mod variability cat I  Toco irreg at admission   Prenatal labs: ABO, Rh: --/--/A POS (12/18 0043) Antibody: NEG (12/18 0043) Rubella: Immune (06/03 0000) RPR: Nonreactive (06/03 0000)  HBsAg: Negative (06/03 0000)  HIV: Non-reactive (06/03 0000)  GBS: Negative (11/25 0000)  Ultrascreen and AFP1 nl.   Assessment/Plan: G3P1011, 39 wks, IOL Cytotec one dose, then pitocin at 4 hrs as needed, routine labor care, epidural pl, GBS(-) EFW 8 lbs    Elveria Royals 02/07/2018, 6:39 AM

## 2018-02-07 NOTE — Anesthesia Postprocedure Evaluation (Signed)
Anesthesia Post Note  Patient: Anna Martin  Procedure(s) Performed: AN AD [place]     Patient location during evaluation: Mother Baby Anesthesia Type: Epidural Level of consciousness: awake and alert and oriented Pain management: satisfactory to patient Vital Signs Assessment: post-procedure vital signs reviewed and stable Respiratory status: respiratory function stable Cardiovascular status: stable Postop Assessment: no headache, no backache, epidural receding, patient able to bend at knees, no signs of nausea or vomiting and adequate PO intake Anesthetic complications: no    Last Vitals:  Vitals:   02/07/18 1720 02/07/18 1823  BP: 122/71 124/65  Pulse: 98 (!) 106  Resp:    Temp: 36.8 C 37.9 C  SpO2: 99% 98%    Last Pain:  Vitals:   02/07/18 1823  TempSrc: Oral  PainSc:    Pain Goal:                 Levin Dagostino

## 2018-02-07 NOTE — Anesthesia Procedure Notes (Addendum)
Epidural Patient location during procedure: OB Start time: 02/07/2018 10:43 AM End time: 02/07/2018 10:51 AM  Staffing Anesthesiologist: Josephine Igo, MD Performed: anesthesiologist   Preanesthetic Checklist Completed: patient identified, site marked, surgical consent, pre-op evaluation, timeout performed, IV checked, risks and benefits discussed and monitors and equipment checked  Epidural Patient position: sitting Prep: site prepped and draped and DuraPrep Patient monitoring: continuous pulse ox and blood pressure Approach: midline Location: L3-L4 Injection technique: LOR air  Needle:  Needle type: Tuohy  Needle gauge: 17 G Needle length: 9 cm and 9 Needle insertion depth: 4 cm Catheter type: closed end flexible Catheter size: 19 Gauge Catheter at skin depth: 9 cm Test dose: negative and Other  Assessment Events: blood not aspirated, injection not painful, no injection resistance, negative IV test and no paresthesia  Additional Notes Patient identified. Risks and benefits discussed including failed block, incomplete  Pain control, post dural puncture headache, nerve damage, paralysis, blood pressure Changes, nausea, vomiting, reactions to medications-both toxic and allergic and post Partum back pain. All questions were answered. Patient expressed understanding and wished to proceed. Sterile technique was used throughout procedure. Epidural site was Dressed with sterile barrier dressing. No paresthesias, signs of intravascular injection Or signs of intrathecal spread were encountered.  Patient was more comfortable after the epidural was dosed. Please see RN's note for documentation of vital signs and FHR which are stable. Reason for block:procedure for pain

## 2018-02-07 NOTE — Anesthesia Preprocedure Evaluation (Signed)
Anesthesia Evaluation  Patient identified by MRN, date of birth, ID band Patient awake    Reviewed: Allergy & Precautions, Patient's Chart, lab work & pertinent test results  Airway Mallampati: II  TM Distance: >3 FB Neck ROM: Full    Dental no notable dental hx. (+) Teeth Intact   Pulmonary neg pulmonary ROS,    Pulmonary exam normal breath sounds clear to auscultation       Cardiovascular Normal cardiovascular exam Rhythm:Regular Rate:Normal  Hx/o tachycardia, not on any Rx   Neuro/Psych  Headaches, negative psych ROS   GI/Hepatic Neg liver ROS, GERD  ,  Endo/Other  negative endocrine ROS  Renal/GU negative Renal ROS     Musculoskeletal negative musculoskeletal ROS (+)   Abdominal   Peds  Hematology negative hematology ROS (+)   Anesthesia Other Findings   Reproductive/Obstetrics (+) Pregnancy                             Anesthesia Physical Anesthesia Plan  ASA: II  Anesthesia Plan: Epidural   Post-op Pain Management:    Induction:   PONV Risk Score and Plan:   Airway Management Planned: Natural Airway  Additional Equipment:   Intra-op Plan:   Post-operative Plan:   Informed Consent: I have reviewed the patients History and Physical, chart, labs and discussed the procedure including the risks, benefits and alternatives for the proposed anesthesia with the patient or authorized representative who has indicated his/her understanding and acceptance.     Plan Discussed with: Anesthesiologist  Anesthesia Plan Comments:         Anesthesia Quick Evaluation

## 2018-02-07 NOTE — Progress Notes (Addendum)
Patient ID: Anna Martin, female   DOB: [birth year], 27 y.o.   MRN: [id number] Subjective: Doing well, starting to feel UCs, no LOF, VB.   Objective: BP 111/70   Pulse 91   Temp 98 F (36.7 C) (Oral)   Resp 18   Ht 5\' 7"  (1.702 m)   Wt 74.4 kg   BMI 25.69 kg/m   FHT:  FHR: 145 bpm, variability: moderate,  accelerations:  Present,  decelerations:  Absent UC:   regular, every 3 minutes SVE:   Dilation: 2.5 Effacement (%): 50 Station: -2 Exam by:: Dr. Benjie Karvonen AROM, clear fluid. Head well applied to cx.   Assessment / Plan: Induction of labor due to term with favorable cervix,  progressing well on pitocin  Fetal Wellbeing:  Category I Pain Control:  Epidural per pt request  GBS(-)  Anticipated MOD:  NSVD  Anna Martin 02/07/2018, 9:14 AM

## 2018-02-07 NOTE — Anesthesia Pain Management Evaluation Note (Signed)
  CRNA Pain Management Visit Note  Patient: Anna Martin, 27 y.o., female  "Hello I am a member of the anesthesia team at [facility]. We have an anesthesia team available at all times to provide care throughout the hospital, including epidural management and anesthesia for C-section. I don't know your plan for the delivery whether it a natural birth, water birth, IV sedation, nitrous supplementation, doula or epidural, but we want to meet your pain goals."   1.Was your pain managed to your expectations on prior hospitalizations?   Yes   2.What is your expectation for pain management during this hospitalization?     Epidural  3.How can we help you reach that goal? Epidural when appropriate  Record the patient's initial score and the patient's pain goal.   Pain: 8  Pain Goal: 8 [facility] wants you to be able to say your pain was always managed very well.  Bufford Spikes 02/07/2018

## 2018-02-08 ENCOUNTER — Inpatient Hospital Stay (HOSPITAL_COMMUNITY): Admission: RE | Admit: 2018-02-08 | Payer: PRIVATE HEALTH INSURANCE | Source: Ambulatory Visit

## 2018-02-08 LAB — CBC
HEMATOCRIT: 32.7 % — AB (ref 36.0–46.0)
Hemoglobin: 11.1 g/dL — ABNORMAL LOW (ref 12.0–15.0)
MCH: 30.1 pg (ref 26.0–34.0)
MCHC: 33.9 g/dL (ref 30.0–36.0)
MCV: 88.6 fL (ref 80.0–100.0)
Platelets: 189 10*3/uL (ref 150–400)
RBC: 3.69 MIL/uL — ABNORMAL LOW (ref 3.87–5.11)
RDW: 13.3 % (ref 11.5–15.5)
WBC: 14.8 10*3/uL — ABNORMAL HIGH (ref 4.0–10.5)
nRBC: 0 % (ref 0.0–0.2)

## 2018-02-08 MED ORDER — IBUPROFEN 600 MG PO TABS: 600.0000 mg | ORAL_TABLET | Freq: Four times a day (QID) | ORAL | 0 refills | Status: DC

## 2018-02-08 NOTE — Plan of Care (Signed)
  Problem: Education: Goal: Knowledge of condition will improve Note:  Patient doing well and comfortable. Encouraged patient to call for any assistance.

## 2018-02-08 NOTE — Lactation Note (Signed)
This note was copied from a baby's chart. Lactation Consultation Note  Patient Name: Anna Martin Today's Date: 02/08/2018 Reason for consult: Follow-up assessment;Term  P2 mother whose infant is now 91 hours old.  Mother is an early discharge.  Baby was very fussy when I entered the room.  Mother stated that he had gotten circumcised this a.m. and that he had only been feeding for a few minutes before falling asleep.  Mom stated that he was using her breast to fall asleep.  Offered to assist with latching and mother accepted.  Her breasts are soft and non tender and nipples are everted but irritated.  She has a positional stripe on the right nipple and both nipples are reddened and sensitive.  I suggested this was probably due to a few shallows latches.  Assisted baby to latch in the football hold on the right breast without difficulty.  He took a few sucks and became restless and irritated.  Burped baby and tried again with the same result.  He was not interested in breast feeding but could not calm down either.  Mother did some hand expression and I finger fed him approximately 2 mls of EBM.  Father changed a diaper and I suggested trying the cross cradle on the left breast.  This time he was able to latch and sucked for about 5 minutes before self releasing.  Mother stated the latch felt "much better" than it had been feeling.  I encouraged a wide mouth, deep latch and suggested that her fingers needed to be back away from the areola when latching to obtain the deep latch.  Mother verbalized understanding.  Encouraged EBM and coconut oil for nipple comfort.  Comfort gels with instructions for use also given.  Mother felt immediate relief with the gels.  Manual pump provided and engorgement prevention/treatment reviewed.  Mother is ready to be discharged and has our OP phone number for questions/concerns after discharge.  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  Father present and supportive.     Maternal Data Formula Feeding for Exclusion: No Has patient been taught Hand Expression?: Yes  Feeding Feeding Type: Breast Fed  LATCH Score Latch: Grasps breast easily, tongue down, lips flanged, rhythmical sucking.  Audible Swallowing: Spontaneous and intermittent  Type of Nipple: Everted at rest and after stimulation  Comfort (Breast/Nipple): Filling, red/small blisters or bruises, mild/mod discomfort  Hold (Positioning): Assistance needed to correctly position infant at breast and maintain latch.  LATCH Score: 8  Interventions Interventions: Breast feeding basics reviewed;Assisted with latch;Skin to skin;Breast massage;Hand express;Breast compression;Hand pump;Comfort gels;Position options;Support pillows;Adjust position  Lactation Tools Discussed/Used WIC Program: No Pump Review: Setup, frequency, and cleaning;Milk Storage Initiated by:: Nina Mondor Date initiated:: 02/08/18   Consult Status Consult Status: Complete    Clyde Upshaw R Caius Silbernagel 02/08/2018, 5:42 PM

## 2018-02-08 NOTE — Lactation Note (Signed)
This note was copied from a baby's chart. Lactation Consultation Note  Patient Name: Anna Martin Today's Date: 02/08/2018   [facility] paperwork completed; Medela "Freestyle" provided. Mom requests that I return in about 30 minutes (infant pictures are being taken and there are multiple visitors in room).   Matthias Hughs [facility] 02/08/2018, 3:42 PM

## 2018-02-08 NOTE — Discharge Summary (Signed)
OB Discharge Summary  Patient Name: Anna Martin DOB: 06-28-1990 MRN: [id number]  Date of admission: 02/07/2018 Delivering MD: MODY, VAISHALI   Date of discharge: 02/08/2018  Admitting diagnosis: INDUCTION Intrauterine pregnancy: [redacted]w[redacted]d     Secondary diagnosis:Active Problems:   Encounter for planned induction of labor   SVD (spontaneous vaginal delivery)  Additional problems:PPH     Discharge diagnosis: Term Pregnancy Delivered                                                                     Post partum procedures:none  Augmentation: Pitocin and Cytotec  Complications: [id number]  Hospital course:  Induction of Labor With Vaginal Delivery   27 y.o. yo G3P1011 at [redacted]w[redacted]d was admitted to the hospital 02/07/2018 for induction of labor.  Indication for induction: Favorable cervix at term.  Patient had an uncomplicated labor course as follows: Membrane Rupture Time/Date: 8:58 AM ,02/07/2018   Intrapartum Procedures: Episiotomy: None [1]                                         Lacerations:  None [1]  Patient had delivery of a Viable infant.  Information for the patient's newborn:  Marilena, Trevathan [[id number]]  Delivery Method: Vag-Spont(Filed from delivery)   02/07/2018  Details of delivery can be found in separate delivery note.  Patient had a routine postpartum course. Patient is discharged home 02/08/18.   On day of d/c pt notes normal lochia, no si/sx anemia. Ambulating w/o difficulty.   Physical exam  Vitals:   02/07/18 1900 02/07/18 2220 02/08/18 0230 02/08/18 0625  BP:  119/71 106/62 115/73  Pulse:  86 83 96  Resp:  16 16 16   Temp: 98.6 F (37 C) 99.6 F (37.6 C) 98.4 F (36.9 C) 98 F (36.7 C)  TempSrc: Oral  Oral   SpO2:   98% 99%  Weight:      Height:       General: alert Lochia: appropriate Uterine Fundus: firm Incision: N/A DVT Evaluation: No evidence of DVT seen on physical exam. Labs: Lab Results  Component Value Date    WBC 14.8 (H) 02/08/2018   HGB 11.1 (L) 02/08/2018   HCT 32.7 (L) 02/08/2018   MCV 88.6 02/08/2018   PLT 189 02/08/2018   CMP Latest Ref Rng & Units 01/15/2014  Glucose 70 - 99 mg/dL 77  BUN 6 - 23 mg/dL 7  Creatinine 0.50 - 1.10 mg/dL 0.58  Sodium 137 - 147 mEq/L 136(L)  Potassium 3.7 - 5.3 mEq/L 3.6(L)  Chloride 96 - 112 mEq/L 100  CO2 19 - 32 mEq/L 23  Calcium 8.4 - 10.5 mg/dL 8.4    Discharge instruction: per After Visit Summary and "Baby and Me Booklet".  After Visit Meds:  Allergies as of 02/08/2018   No Known Allergies     Medication List    TAKE these medications   ibuprofen 600 MG tablet Commonly known as:  ADVIL,MOTRIN Take 1 tablet (600 mg total) by mouth every 6 (six) hours.   prenatal multivitamin Tabs tablet Take 1 tablet by mouth at bedtime.  Diet: routine diet  Activity: Advance as tolerated. Pelvic rest for 6 weeks.   Outpatient follow up:6 weeks Follow up Appt:No future appointments. Follow up visit: No follow-ups on file.  Postpartum contraception: Not Discussed  Newborn Data: Live born female  Birth Weight: 8 lb 4.8 oz (3765 g) APGAR: 8, 9  Newborn Delivery   Birth date/time:  02/07/2018 14:55:00 Delivery type:  Vaginal, Spontaneous     Baby Feeding: not discussed Disposition:home with mother   02/08/2018 Ala Dach, MD

## 2018-02-19 ENCOUNTER — Inpatient Hospital Stay (HOSPITAL_COMMUNITY): Admit: 2018-02-19 | Payer: 59 | Admitting: Obstetrics & Gynecology

## 2018-05-09 ENCOUNTER — Encounter (HOSPITAL_COMMUNITY): Payer: Self-pay

## 2018-11-09 DIAGNOSIS — L720 Epidermal cyst: Secondary | ICD-10-CM | POA: Diagnosis not present

## 2018-12-26 DIAGNOSIS — U071 COVID-19: Secondary | ICD-10-CM | POA: Diagnosis not present

## 2019-02-16 ENCOUNTER — Telehealth: Payer: Self-pay | Admitting: Lactation Services

## 2019-02-16 NOTE — Telephone Encounter (Signed)
Mom had called at 30 re questions, I called her back shortly and mom had been at her sister's renovated home that had had lead removal and then repainted, no demolition currently, mom wonders if she could be affected by lead and could pass this to baby breastfeeding at 28 yr old, I reassured her that she should not have been exposed to lead at this point and therefore should be no concern for the baby ingesting lead through the breast milk., mom is relieved, encouraged to call back to [facility] office if any other concerns

## 2019-04-22 DIAGNOSIS — Z01419 Encounter for gynecological examination (general) (routine) without abnormal findings: Secondary | ICD-10-CM | POA: Diagnosis not present

## 2019-04-22 DIAGNOSIS — Z1151 Encounter for screening for human papillomavirus (HPV): Secondary | ICD-10-CM | POA: Diagnosis not present

## 2019-04-22 DIAGNOSIS — Z681 Body mass index (BMI) 19 or less, adult: Secondary | ICD-10-CM | POA: Diagnosis not present

## 2019-07-19 DIAGNOSIS — Z3689 Encounter for other specified antenatal screening: Secondary | ICD-10-CM | POA: Diagnosis not present

## 2019-07-19 DIAGNOSIS — O09291 Supervision of pregnancy with other poor reproductive or obstetric history, first trimester: Secondary | ICD-10-CM | POA: Diagnosis not present

## 2019-07-19 DIAGNOSIS — Z3A01 Less than 8 weeks gestation of pregnancy: Secondary | ICD-10-CM | POA: Diagnosis not present

## 2019-07-19 DIAGNOSIS — Z3201 Encounter for pregnancy test, result positive: Secondary | ICD-10-CM | POA: Diagnosis not present

## 2019-07-19 DIAGNOSIS — Z3A08 8 weeks gestation of pregnancy: Secondary | ICD-10-CM | POA: Diagnosis not present

## 2019-07-24 DIAGNOSIS — O09291 Supervision of pregnancy with other poor reproductive or obstetric history, first trimester: Secondary | ICD-10-CM | POA: Diagnosis not present

## 2019-07-24 DIAGNOSIS — Z3A08 8 weeks gestation of pregnancy: Secondary | ICD-10-CM | POA: Diagnosis not present

## 2019-08-01 DIAGNOSIS — Z3A08 8 weeks gestation of pregnancy: Secondary | ICD-10-CM | POA: Diagnosis not present

## 2019-08-01 DIAGNOSIS — O09291 Supervision of pregnancy with other poor reproductive or obstetric history, first trimester: Secondary | ICD-10-CM | POA: Diagnosis not present

## 2019-08-16 MED FILL — PROGESTERONE 200 MG CAPS: 200 | 30 days supply | Qty: 30 | Fill #0

## 2019-08-19 DIAGNOSIS — Z3201 Encounter for pregnancy test, result positive: Secondary | ICD-10-CM | POA: Diagnosis not present

## 2019-09-05 DIAGNOSIS — O09521 Supervision of elderly multigravida, first trimester: Secondary | ICD-10-CM | POA: Diagnosis not present

## 2019-09-05 DIAGNOSIS — Z3689 Encounter for other specified antenatal screening: Secondary | ICD-10-CM | POA: Diagnosis not present

## 2019-09-05 DIAGNOSIS — O09291 Supervision of pregnancy with other poor reproductive or obstetric history, first trimester: Secondary | ICD-10-CM | POA: Diagnosis not present

## 2019-09-05 DIAGNOSIS — Z3A10 10 weeks gestation of pregnancy: Secondary | ICD-10-CM | POA: Diagnosis not present

## 2019-09-05 LAB — OB RESULTS CONSOLE GC/CHLAMYDIA
Chlamydia: NEGATIVE
Gonorrhea: NEGATIVE

## 2019-09-05 LAB — OB RESULTS CONSOLE HEPATITIS B SURFACE ANTIGEN: Hepatitis B Surface Ag: NEGATIVE

## 2019-09-05 LAB — OB RESULTS CONSOLE RPR: RPR: NONREACTIVE

## 2019-09-05 LAB — OB RESULTS CONSOLE RUBELLA ANTIBODY, IGM: Rubella: IMMUNE

## 2019-09-05 LAB — OB RESULTS CONSOLE HIV ANTIBODY (ROUTINE TESTING): HIV: NONREACTIVE

## 2019-10-07 DIAGNOSIS — Z361 Encounter for antenatal screening for raised alphafetoprotein level: Secondary | ICD-10-CM | POA: Diagnosis not present

## 2019-11-04 DIAGNOSIS — Z3A19 19 weeks gestation of pregnancy: Secondary | ICD-10-CM | POA: Diagnosis not present

## 2019-11-04 DIAGNOSIS — O43192 Other malformation of placenta, second trimester: Secondary | ICD-10-CM | POA: Diagnosis not present

## 2019-11-04 DIAGNOSIS — O09522 Supervision of elderly multigravida, second trimester: Secondary | ICD-10-CM | POA: Diagnosis not present

## 2019-11-04 DIAGNOSIS — O358XX0 Maternal care for other (suspected) fetal abnormality and damage, not applicable or unspecified: Secondary | ICD-10-CM | POA: Diagnosis not present

## 2019-11-05 ENCOUNTER — Other Ambulatory Visit: Payer: Self-pay | Admitting: Obstetrics & Gynecology

## 2019-11-05 DIAGNOSIS — Z3A19 19 weeks gestation of pregnancy: Secondary | ICD-10-CM

## 2019-11-05 DIAGNOSIS — O35EXX0 Maternal care for other (suspected) fetal abnormality and damage, fetal genitourinary anomalies, not applicable or unspecified: Secondary | ICD-10-CM

## 2019-11-19 ENCOUNTER — Encounter: Payer: Self-pay | Admitting: *Deleted

## 2019-11-20 ENCOUNTER — Ambulatory Visit: Payer: 59 | Attending: Obstetrics & Gynecology

## 2019-11-20 ENCOUNTER — Ambulatory Visit: Payer: Self-pay | Admitting: Genetic Counselor

## 2019-11-20 ENCOUNTER — Other Ambulatory Visit: Payer: Self-pay

## 2019-11-20 ENCOUNTER — Ambulatory Visit (HOSPITAL_BASED_OUTPATIENT_CLINIC_OR_DEPARTMENT_OTHER): Payer: 59 | Admitting: Genetic Counselor

## 2019-11-20 ENCOUNTER — Other Ambulatory Visit: Payer: Self-pay | Admitting: *Deleted

## 2019-11-20 ENCOUNTER — Ambulatory Visit: Payer: 59 | Admitting: *Deleted

## 2019-11-20 VITALS — BP 133/73 | HR 120

## 2019-11-20 DIAGNOSIS — Z362 Encounter for other antenatal screening follow-up: Secondary | ICD-10-CM

## 2019-11-20 DIAGNOSIS — O35EXX0 Maternal care for other (suspected) fetal abnormality and damage, fetal genitourinary anomalies, not applicable or unspecified: Secondary | ICD-10-CM

## 2019-11-20 DIAGNOSIS — Z315 Encounter for genetic counseling: Secondary | ICD-10-CM | POA: Diagnosis not present

## 2019-11-20 DIAGNOSIS — O358XX0 Maternal care for other (suspected) fetal abnormality and damage, not applicable or unspecified: Secondary | ICD-10-CM

## 2019-11-20 DIAGNOSIS — Z3A19 19 weeks gestation of pregnancy: Secondary | ICD-10-CM | POA: Insufficient documentation

## 2019-11-20 DIAGNOSIS — O283 Abnormal ultrasonic finding on antenatal screening of mother: Secondary | ICD-10-CM

## 2019-11-20 NOTE — Progress Notes (Signed)
11/20/2019  Anna Martin [birth year] MRN: [id number] DOV: 11/20/2019  Anna Martin presented to the [facility] for a genetics consultation regarding soft markers identified on ultrasound. Anna Martin was accompanied to her appointment by her husband, [name].   Indication for genetic counseling - Soft markers identified on ultrasound (bilateral choroid plexus cysts, pyelectasis)  Prenatal history  Anna Martin is a [id number], 29 y.o. female. Her current pregnancy has completed [redacted]w[redacted]d (Estimated Date of Delivery: 03/28/20). Anna Martin and her husband have a 49 year old son and a 70 month old son. Anna Martin has also had two spontaneous miscarriages, one at 66 weeks' gestation and the other around 4 weeks' gestation.   Anna Martin denied exposure to environmental toxins or chemical agents. She denied the use of alcohol, tobacco or street drugs. She reported taking prenatal vitamins, baby aspirin, and progesterone. She denied significant viral illnesses and fevers during the course of her pregnancy. She reported spotting around 7-8 weeks' gestation. Her medical and surgical histories were noncontributory.  Family History  A three generation pedigree was drafted and reviewed. The family history is remarkable for the following:  - Anna Martin has a maternal aunt whose daughter has reflex sympathetic dystrophy (RSD). Per the [employer] [facility], is possible that genetic factors may contribute to RSD; however, there is no clear genetic risk pattern known at this time. For this reason, risk assessment for RSD in Anna Martin children cannot be completed.  - Anna Martin has a paternal cousin who has had several miscarriages reportedly due to polycystic ovarian syndrome. This cousin's brother also had a son who died in infancy due to a genetic condition. Anna Martin was unsure of the exact diagnosis this relative had;  thus, risk assessment was limited.  The remaining family histories were reviewed and found to be noncontributory for birth defects, intellectual disability, recurrent pregnancy loss, and known genetic conditions.    The patient's ethnicity is European. The father of the pregnancy's ethnicity is [place]. Ashkenazi Jewish ancestry and consanguinity were denied. Pedigree will be scanned under Media.  Discussion  Ultrasound findings:  Anna Martin was referred for genetic counseling to discuss ultrasound findings identified on her anatomy ultrasound with her OBGYN provider. Per records, bilateral choroid plexus cysts and pyelectasis were identified on ultrasound. Anna Martin had a complete ultrasound was performed today prior to our visit. The ultrasound report will be sent under separate cover. Left unilateral renal pyelectasis was confirmed today. Choroid plexus cysts have since resolved.   We discussed that the second trimester anatomy ultrasound is targeted at identifying congenital birth defects and features associated with aneuploidy. It has evolved as a screening tool used to provide an individualized risk assessment for Down syndrome and other trisomies. The ability of ultrasound to aid in the detection of aneuploidies relies on identification of both major structural anomalies and soft markers. The term "soft markers" refers to findings that are often normal variants and do not cause any significant medical problems for a baby postnatally. Nonetheless, these soft markers have known associations with aneuploidy. We discussed the findings of choroid plexus cysts and pyelectasis in detail.  Choroid plexus cysts:  The choroid plexus is an area in the brain where cerebral spinal fluid, the fluid that bathes the brain and spinal cord, is made. Cysts, or fluid filled sacs, are sometimes found in the choroid plexus of babies both before and after they are born. Anna Martin was counseled that  around 1-2.5%of pregnancies evaluated by ultrasound will show choroid plexus cysts (CPCs). Literature suggests that CPCs are an ultrasound finding in approximately 30-50% of fetuses with trisomy 40. Anna Martin was counseled that when a patient has other risk factors for fetal trisomy 54, including other ultrasound findings, CPCs are associated with an increased risk for fetal trisomy 19. CPCs are not associated with an increased risk for fetal Down syndrome.  Pyelectasis:  Pyelectasis, or urinary tract dilation (UTD) is a mild enlargement of the central area, or "pelvis" of the kidney. The increase in size may be the result of urine not being able to flow freely from the kidney to the bladder due to ureteropelvic junction obstruction. Urine can also back up from the bladder into the kidneys and cause dilation; this is known as vesicoureteral reflux. Anna Martin was counseled that pyelectasis is considered a soft marker for Down syndrome. It is present in approximately 1-2% of chromosomally normal fetuses, but up to 17% of fetuses with Down syndrome.   Aneuploidy screening results:  Per Anna Martin, she had noninvasive prenatal screening (NIPS) performed through the laboratory [facility] that was low-risk for fetal aneuploidies. This report was not available for my review. We reviewed that low-risk NIPS results greatly reduce the likelihood of trisomies 21, 13, or 18 for the pregnancy. Thus, it is unlikely that the soft markers identified on ultrasound are related to Down syndrome or trisomy 18 in the current fetus. However, we reviewed that while this testing identifies 94-99% of pregnancies with trisomy 4, trisomy 22, and trisomy 45, it is NOT diagnostic. A positive test result requires confirmation by CVS or amniocentesis, and a negative test result does not rule out a fetal chromosome abnormality. She also understands that this testing does not identify all genetic conditions.  Diagnostic  testing:  Anna Martin was also counseled regarding diagnostic testing via amniocentesis. We discussed the technical aspects of the procedure and quoted up to a 1 in 500 (0.2%) risk for spontaneous pregnancy loss or other adverse pregnancy outcomes as a result of amniocentesis. Cultured cells from an amniocentesis sample allow for the visualization of a fetal karyotype, which can detect >99% of large chromosomal aberrations. Chromosomal microarray can also be performed to identify smaller deletions or duplications of fetal chromosomal material. Ms. Bernath was informed that this amniocentesis is the only way to definitively determine whether a fetus has Down syndrome or trisomy 18 during the prenatal period. After careful consideration, Ms. Haslem declined amniocentesis at this time. She understands that amniocentesis is available at any point after 16 weeks of pregnancy and that she may opt to undergo the procedure at a later date should she change her mind.  Carrier screening:  Per ACOG recommendation, carrier screening for hemoglobinopathies, cystic fibrosis (CF) and spinal muscular atrophy (SMA) was discussed including information about the conditions, rationale for testing, autosomal recessive inheritance, and the option of prenatal diagnosis. I offered carrier screening for these conditions, which Ms. Brousseau declined at this time. Without carrier screening to refine risk and based on ethnicity alone, Ms. Wares chance of being a carrier for CF is 1 in 15. Her chance of being a carrier for SMA is 1 in 60. Her chance of being a carrier for HBB-related hemoglobinopathies is 1 in 71. Ms. Montesinos was informed that select hemoglobinopathies, CF, and SMA are included on [place]'s newborn screen.   Plan:   Additional screening and diagnostic testing were declined today. Ms. Riggle felt reassured by her ultrasound findings  today in addition to her low-risk NIPS result. She  understands that screening tests, including ultrasound and NIPS, cannot rule out all birth defects or genetic syndromes. She will return in eight weeks for another ultrasound to reassess fetal pyelectasis.  I counseled Ms. Rosenzweig regarding the above risks and available options. The approximate face-to-face time with the genetic counselor was 30 minutes.  In summary:  Reviewed ultrasound findings  Bilateral choroid plexus cysts (resolved) and unilateral pyelectasis  Findings are considered soft markers for trisomy 32 and trisomy 21 and are likely normal in context of low-risk NIPS  Reviewed low-risk NIPS result  Reduction in risk for chromosomal aneuploidies, including trisomy 28 and trisomy 21  Report not available for my review  Offered additional testing and screening  Declined amniocentesis  Declined carrier screening for cystic fibrosis, spinal muscular atrophy, and hemoglobinopathies  Reviewed family history concerns   Buelah Manis, MS, Counselling psychologist

## 2019-12-06 ENCOUNTER — Other Ambulatory Visit: Payer: Self-pay

## 2020-01-09 DIAGNOSIS — Z3689 Encounter for other specified antenatal screening: Secondary | ICD-10-CM | POA: Diagnosis not present

## 2020-01-09 DIAGNOSIS — Z23 Encounter for immunization: Secondary | ICD-10-CM | POA: Diagnosis not present

## 2020-01-09 DIAGNOSIS — Z3A28 28 weeks gestation of pregnancy: Secondary | ICD-10-CM | POA: Diagnosis not present

## 2020-01-09 DIAGNOSIS — Z3686 Encounter for antenatal screening for cervical length: Secondary | ICD-10-CM | POA: Diagnosis not present

## 2020-01-09 DIAGNOSIS — O358XX0 Maternal care for other (suspected) fetal abnormality and damage, not applicable or unspecified: Secondary | ICD-10-CM | POA: Diagnosis not present

## 2020-01-15 ENCOUNTER — Ambulatory Visit: Payer: PRIVATE HEALTH INSURANCE

## 2020-01-20 DIAGNOSIS — Z3480 Encounter for supervision of other normal pregnancy, unspecified trimester: Secondary | ICD-10-CM | POA: Diagnosis not present

## 2020-02-20 DIAGNOSIS — Z3A34 34 weeks gestation of pregnancy: Secondary | ICD-10-CM | POA: Diagnosis not present

## 2020-02-20 DIAGNOSIS — O09293 Supervision of pregnancy with other poor reproductive or obstetric history, third trimester: Secondary | ICD-10-CM | POA: Diagnosis not present

## 2020-02-20 DIAGNOSIS — O43123 Velamentous insertion of umbilical cord, third trimester: Secondary | ICD-10-CM | POA: Diagnosis not present

## 2020-02-20 DIAGNOSIS — O368930 Maternal care for other specified fetal problems, third trimester, not applicable or unspecified: Secondary | ICD-10-CM | POA: Diagnosis not present

## 2020-02-22 NOTE — L&D Delivery Note (Addendum)
Delivery Note At 6:33 PM a viable and healthy female was delivered via  (Presentation:OA ).  APGAR: 8,9 ; weight pending    Placenta status: Expressed, Intact.  Cord with no complications: Cord pH: NA  Anesthesia:  Epidural  Episiotomy:  none Lacerations:  None  Est. Blood Loss (mL):  250 cc  Mom to postpartum.  Baby to Couplet care / Skin to Skin.  Anna Martin 03/23/2020, 6:45 PM

## 2020-03-04 DIAGNOSIS — Z3A36 36 weeks gestation of pregnancy: Secondary | ICD-10-CM | POA: Diagnosis not present

## 2020-03-04 DIAGNOSIS — O3693X0 Maternal care for fetal problem, unspecified, third trimester, not applicable or unspecified: Secondary | ICD-10-CM | POA: Diagnosis not present

## 2020-03-04 DIAGNOSIS — O09293 Supervision of pregnancy with other poor reproductive or obstetric history, third trimester: Secondary | ICD-10-CM | POA: Diagnosis not present

## 2020-03-04 DIAGNOSIS — Z3685 Encounter for antenatal screening for Streptococcus B: Secondary | ICD-10-CM | POA: Diagnosis not present

## 2020-03-04 DIAGNOSIS — Z3483 Encounter for supervision of other normal pregnancy, third trimester: Secondary | ICD-10-CM | POA: Diagnosis not present

## 2020-03-04 LAB — OB RESULTS CONSOLE GBS: GBS: NEGATIVE

## 2020-03-10 ENCOUNTER — Telehealth (HOSPITAL_COMMUNITY): Payer: Self-pay | Admitting: *Deleted

## 2020-03-10 NOTE — Telephone Encounter (Signed)
Preadmission screen  

## 2020-03-11 ENCOUNTER — Telehealth (HOSPITAL_COMMUNITY): Payer: Self-pay | Admitting: *Deleted

## 2020-03-11 ENCOUNTER — Encounter (HOSPITAL_COMMUNITY): Payer: Self-pay | Admitting: *Deleted

## 2020-03-11 NOTE — Telephone Encounter (Signed)
Preadmission screen  

## 2020-03-20 ENCOUNTER — Other Ambulatory Visit (HOSPITAL_COMMUNITY)
Admission: RE | Admit: 2020-03-20 | Discharge: 2020-03-20 | Disposition: A | Discharge status: Home / Self Care | Payer: 59 | Source: Ambulatory Visit | Attending: Obstetrics & Gynecology | Admitting: Obstetrics & Gynecology

## 2020-03-20 ENCOUNTER — Other Ambulatory Visit: Payer: Self-pay | Admitting: Obstetrics & Gynecology

## 2020-03-20 DIAGNOSIS — Z01812 Encounter for preprocedural laboratory examination: Secondary | ICD-10-CM | POA: Insufficient documentation

## 2020-03-20 DIAGNOSIS — O26893 Other specified pregnancy related conditions, third trimester: Secondary | ICD-10-CM | POA: Diagnosis present

## 2020-03-20 DIAGNOSIS — O358XX0 Maternal care for other (suspected) fetal abnormality and damage, not applicable or unspecified: Secondary | ICD-10-CM | POA: Diagnosis not present

## 2020-03-20 DIAGNOSIS — Z20822 Contact with and (suspected) exposure to covid-19: Secondary | ICD-10-CM | POA: Insufficient documentation

## 2020-03-20 DIAGNOSIS — Z3A39 39 weeks gestation of pregnancy: Secondary | ICD-10-CM | POA: Diagnosis not present

## 2020-03-20 LAB — SARS CORONAVIRUS 2 (TAT 6-24 HRS): SARS Coronavirus 2: NEGATIVE

## 2020-03-21 ENCOUNTER — Other Ambulatory Visit (HOSPITAL_COMMUNITY): Payer: 59

## 2020-03-23 ENCOUNTER — Other Ambulatory Visit: Payer: Self-pay

## 2020-03-23 ENCOUNTER — Encounter (HOSPITAL_COMMUNITY): Payer: Self-pay | Admitting: Obstetrics & Gynecology

## 2020-03-23 ENCOUNTER — Inpatient Hospital Stay (HOSPITAL_COMMUNITY): Payer: 59 | Admitting: Anesthesiology

## 2020-03-23 ENCOUNTER — Inpatient Hospital Stay (HOSPITAL_COMMUNITY)
Admission: AD | Admit: 2020-03-23 | Discharge: 2020-03-25 | DRG: 807 | Disposition: A | Discharge status: Home / Self Care | Payer: 59 | Attending: Obstetrics & Gynecology | Admitting: Obstetrics & Gynecology

## 2020-03-23 ENCOUNTER — Inpatient Hospital Stay (HOSPITAL_COMMUNITY): Payer: 59

## 2020-03-23 DIAGNOSIS — O358XX0 Maternal care for other (suspected) fetal abnormality and damage, not applicable or unspecified: Secondary | ICD-10-CM | POA: Diagnosis present

## 2020-03-23 DIAGNOSIS — Z3490 Encounter for supervision of normal pregnancy, unspecified, unspecified trimester: Secondary | ICD-10-CM | POA: Diagnosis present

## 2020-03-23 DIAGNOSIS — O26893 Other specified pregnancy related conditions, third trimester: Secondary | ICD-10-CM | POA: Diagnosis present

## 2020-03-23 DIAGNOSIS — Z20822 Contact with and (suspected) exposure to covid-19: Secondary | ICD-10-CM | POA: Diagnosis present

## 2020-03-23 DIAGNOSIS — Z3A39 39 weeks gestation of pregnancy: Secondary | ICD-10-CM | POA: Diagnosis not present

## 2020-03-23 LAB — CBC
HCT: 41.4 % (ref 36.0–46.0)
Hemoglobin: 13.8 g/dL (ref 12.0–15.0)
MCH: 29.3 pg (ref 26.0–34.0)
MCHC: 33.3 g/dL (ref 30.0–36.0)
MCV: 87.9 fL (ref 80.0–100.0)
Platelets: 255 10*3/uL (ref 150–400)
RBC: 4.71 MIL/uL (ref 3.87–5.11)
RDW: 13.5 % (ref 11.5–15.5)
WBC: 10.1 10*3/uL (ref 4.0–10.5)
nRBC: 0 % (ref 0.0–0.2)

## 2020-03-23 LAB — TYPE AND SCREEN
ABO/RH(D): A POS
Antibody Screen: NEGATIVE

## 2020-03-23 LAB — RPR: RPR Ser Ql: NONREACTIVE

## 2020-03-23 MED ORDER — FENTANYL-BUPIVACAINE-NACL 0.5-0.125-0.9 MG/250ML-% EP SOLN
EPIDURAL | Status: AC
  Filled 2020-03-23: qty 250

## 2020-03-23 MED ORDER — OXYTOCIN-SODIUM CHLORIDE 30-0.9 UT/500ML-% IV SOLN
2.5000 [IU]/h | INTRAVENOUS | Status: DC
  Administered 2020-03-23: 2.5 [IU]/h via INTRAVENOUS

## 2020-03-23 MED ORDER — LIDOCAINE HCL (PF) 1 % IJ SOLN
INTRAMUSCULAR | Status: DC | PRN
  Administered 2020-03-23: 5 mL via EPIDURAL

## 2020-03-23 MED ORDER — DIPHENHYDRAMINE HCL 25 MG PO CAPS
25.0000 mg | ORAL_CAPSULE | Freq: Four times a day (QID) | ORAL | Status: DC | PRN
Start: 2020-03-23 — End: 2020-03-25

## 2020-03-23 MED ORDER — SENNOSIDES-DOCUSATE SODIUM 8.6-50 MG PO TABS
2.0000 | ORAL_TABLET | Freq: Every day | ORAL | Status: DC
  Filled 2020-03-23: qty 2

## 2020-03-23 MED ORDER — ACETAMINOPHEN 325 MG PO TABS: 650.0000 mg | ORAL_TABLET | ORAL | Status: DC | PRN

## 2020-03-23 MED ORDER — FENTANYL-BUPIVACAINE-NACL 0.5-0.125-0.9 MG/250ML-% EP SOLN
EPIDURAL | Status: DC | PRN
  Administered 2020-03-23: 12 mL/h via EPIDURAL

## 2020-03-23 MED ORDER — TERBUTALINE SULFATE 1 MG/ML IJ SOLN: 0.2500 mg | Freq: Once | INTRAMUSCULAR | Status: DC | PRN

## 2020-03-23 MED ORDER — LACTATED RINGERS IV SOLN
500.0000 mL | INTRAVENOUS | Status: DC | PRN
  Administered 2020-03-23: 500 mL via INTRAVENOUS

## 2020-03-23 MED ORDER — OXYTOCIN-SODIUM CHLORIDE 30-0.9 UT/500ML-% IV SOLN
1.0000 m[IU]/min | INTRAVENOUS | Status: DC
  Administered 2020-03-23: 8 m[IU]/min via INTRAVENOUS
  Administered 2020-03-23: 2 m[IU]/min via INTRAVENOUS
  Administered 2020-03-23: 10 m[IU]/min via INTRAVENOUS
  Administered 2020-03-23: 4 m[IU]/min via INTRAVENOUS
  Administered 2020-03-23: 12 m[IU]/min via INTRAVENOUS
  Filled 2020-03-23: qty 500

## 2020-03-23 MED ORDER — SIMETHICONE 80 MG PO CHEW
80.0000 mg | CHEWABLE_TABLET | ORAL | Status: DC | PRN
Start: 2020-03-23 — End: 2020-03-25

## 2020-03-23 MED ORDER — PRENATAL MULTIVITAMIN CH
1.0000 | ORAL_TABLET | Freq: Every day | ORAL | Status: DC
  Administered 2020-03-24: 1 via ORAL
  Filled 2020-03-23: qty 1

## 2020-03-23 MED ORDER — ONDANSETRON HCL 4 MG/2ML IJ SOLN: 4.0000 mg | INTRAMUSCULAR | Status: DC | PRN

## 2020-03-23 MED ORDER — ONDANSETRON HCL 4 MG PO TABS: 4.0000 mg | ORAL_TABLET | ORAL | Status: DC | PRN

## 2020-03-23 MED ORDER — COCONUT OIL OIL
1.0000 | TOPICAL_OIL | Status: DC | PRN
Start: 2020-03-23 — End: 2020-03-25
  Administered 2020-03-24: 1 via TOPICAL

## 2020-03-23 MED ORDER — LACTATED RINGERS IV SOLN: INTRAVENOUS | Status: DC

## 2020-03-23 MED ORDER — DIBUCAINE (PERIANAL) 1 % EX OINT: 1.0000 "application " | TOPICAL_OINTMENT | CUTANEOUS | Status: DC | PRN

## 2020-03-23 MED ORDER — WITCH HAZEL-GLYCERIN EX PADS: 1.0000 "application " | MEDICATED_PAD | CUTANEOUS | Status: DC | PRN

## 2020-03-23 MED ORDER — FENTANYL-BUPIVACAINE-NACL 0.5-0.125-0.9 MG/250ML-% EP SOLN: 12.0000 mL/h | EPIDURAL | Status: DC | PRN

## 2020-03-23 MED ORDER — LIDOCAINE HCL (PF) 1 % IJ SOLN: 30.0000 mL | INTRAMUSCULAR | Status: DC | PRN

## 2020-03-23 MED ORDER — TETANUS-DIPHTH-ACELL PERTUSSIS 5-2.5-18.5 LF-MCG/0.5 IM SUSY: 0.5000 mL | PREFILLED_SYRINGE | Freq: Once | INTRAMUSCULAR | Status: DC

## 2020-03-23 MED ORDER — IBUPROFEN 600 MG PO TABS
600.0000 mg | ORAL_TABLET | Freq: Four times a day (QID) | ORAL | Status: DC
  Administered 2020-03-23 – 2020-03-25 (×6): 600 mg via ORAL
  Filled 2020-03-23 (×6): qty 1

## 2020-03-23 MED ORDER — BENZOCAINE-MENTHOL 20-0.5 % EX AERO: 1.0000 "application " | INHALATION_SPRAY | CUTANEOUS | Status: DC | PRN

## 2020-03-23 MED ORDER — LACTATED RINGERS IV SOLN: 500.0000 mL | Freq: Once | INTRAVENOUS | Status: DC

## 2020-03-23 MED ORDER — PHENYLEPHRINE 40 MCG/ML (10ML) SYRINGE FOR IV PUSH (FOR BLOOD PRESSURE SUPPORT)
PREFILLED_SYRINGE | INTRAVENOUS | Status: AC
  Filled 2020-03-23: qty 10

## 2020-03-23 MED ORDER — PHENYLEPHRINE 40 MCG/ML (10ML) SYRINGE FOR IV PUSH (FOR BLOOD PRESSURE SUPPORT): 80.0000 ug | PREFILLED_SYRINGE | INTRAVENOUS | Status: DC | PRN

## 2020-03-23 MED ORDER — ONDANSETRON HCL 4 MG/2ML IJ SOLN: 4.0000 mg | Freq: Four times a day (QID) | INTRAMUSCULAR | Status: DC | PRN

## 2020-03-23 MED ORDER — EPHEDRINE 5 MG/ML INJ: 10.0000 mg | INTRAVENOUS | Status: DC | PRN

## 2020-03-23 MED ORDER — ZOLPIDEM TARTRATE 5 MG PO TABS: 5.0000 mg | ORAL_TABLET | Freq: Every evening | ORAL | Status: DC | PRN

## 2020-03-23 MED ORDER — SOD CITRATE-CITRIC ACID 500-334 MG/5ML PO SOLN: 30.0000 mL | ORAL | Status: DC | PRN

## 2020-03-23 MED ORDER — OXYTOCIN BOLUS FROM INFUSION
333.0000 mL | Freq: Once | INTRAVENOUS | Status: AC
  Administered 2020-03-23: 333 mL via INTRAVENOUS

## 2020-03-23 NOTE — Anesthesia Preprocedure Evaluation (Signed)
Anesthesia Evaluation  Patient identified by MRN, date of birth, ID band Patient awake    Reviewed: Allergy & Precautions, Patient's Chart, lab work & pertinent test results  Airway Mallampati: I       Dental no notable dental hx.    Pulmonary    Pulmonary exam normal        Cardiovascular Normal cardiovascular exam     Neuro/Psych  Headaches,    GI/Hepatic   Endo/Other    Renal/GU      Musculoskeletal   Abdominal   Peds  Hematology negative hematology ROS (+)   Anesthesia Other Findings   Reproductive/Obstetrics (+) Pregnancy                             Anesthesia Physical Anesthesia Plan  ASA: II  Anesthesia Plan: Epidural   Post-op Pain Management:    Induction:   PONV Risk Score and Plan: 0  Airway Management Planned: Natural Airway and Simple Face Mask  Additional Equipment: None  Intra-op Plan:   Post-operative Plan:   Informed Consent: I have reviewed the patients History and Physical, chart, labs and discussed the procedure including the risks, benefits and alternatives for the proposed anesthesia with the patient or authorized representative who has indicated his/her understanding and acceptance.       Plan Discussed with:   Anesthesia Plan Comments: (Lab Results      Component                Value               Date                      WBC                      10.1                03/23/2020                HGB                      13.8                03/23/2020                HCT                      41.4                03/23/2020                MCV                      87.9                03/23/2020                PLT                      255                 03/23/2020           )        Anesthesia Quick Evaluation

## 2020-03-23 NOTE — Progress Notes (Signed)
Labor Progress Note  S/O: Patient doing well, starting to feel some mild contractions with the Pitocin, but rating them a 3 out of 10, manageable. Planning on epidural after water is broken when needed. Husband at bedside providing support. Excited for baby girl.  Vitals:   03/23/20 0732 03/23/20 0743  BP:  130/72  Pulse:  (!) 126  Resp:  20  Temp: 99 F (37.2 C)   SpO2:  100%    SVE: 3/50/-3  EFM: cat I baseline 135 bpm mod var +accels, -decels Toco: uterine irritability  A/P: 29Y [id number] @ 39.2 IOL at term favorable cervix GBS-/Rh+  -IOL: currently on Pitocin, AROM for copious amounts of clear fluid, titrate Pitocin as needed per protocol -cont EFM/Toco: cat I tracing -Epidural when requested -routine intrapartum care -anticipate SVD  Linville Decarolis A Mandela Bello 03/23/20 11:26 AM

## 2020-03-23 NOTE — Anesthesia Procedure Notes (Signed)
Epidural Patient location during procedure: OB Start time: 03/23/2020 1:14 PM End time: 03/23/2020 1:20 PM  Staffing Anesthesiologist: Effie Berkshire, MD Performed: anesthesiologist   Preanesthetic Checklist Completed: patient identified, IV checked, site marked, risks and benefits discussed, surgical consent, monitors and equipment checked, pre-op evaluation and timeout performed  Epidural Patient position: sitting Prep: DuraPrep Patient monitoring: heart rate, continuous pulse ox and blood pressure Approach: midline Location: L3-L4 Injection technique: LOR saline  Needle:  Needle type: Tuohy  Needle gauge: 17 G Needle length: 9 cm Catheter type: closed end flexible Catheter size: 20 Guage Test dose: negative and 1.5% lidocaine  Assessment Events: blood not aspirated, injection not painful, no injection resistance and no paresthesia  Additional Notes LOR @ 5  Patient identified. Risks/Benefits/Options discussed with patient including but not limited to bleeding, infection, nerve damage, paralysis, failed block, incomplete pain control, headache, blood pressure changes, nausea, vomiting, reactions to medications, itching and postpartum back pain. Confirmed with bedside nurse the patient's most recent platelet count. Confirmed with patient that they are not currently taking any anticoagulation, have any bleeding history or any family history of bleeding disorders. Patient expressed understanding and wished to proceed. All questions were answered. Sterile technique was used throughout the entire procedure. Please see nursing notes for vital signs. Test dose was given through epidural catheter and negative prior to continuing to dose epidural or start infusion. Warning signs of high block given to the patient including shortness of breath, tingling/numbness in hands, complete motor block, or any concerning symptoms with instructions to call for help. Patient was given instructions on  fall risk and not to get out of bed. All questions and concerns addressed with instructions to call with any issues or inadequate analgesia.    Reason for block:procedure for pain

## 2020-03-23 NOTE — Lactation Note (Signed)
This note was copied from a baby's chart. Lactation Consultation Note  Patient Name: Anna Martin Today's Date: 03/23/2020 Age:32 hours  LC contacted [facility] regarding [place] in L&D. Per RN, mother declined consult at this time.   Anissia Wessells A Higuera Ancidey 03/23/2020, 7:00 PM

## 2020-03-23 NOTE — Progress Notes (Signed)
Anna Martin is a 30 y.o. [id number] at [redacted]w[redacted]d by 1st trim sono, IOL for dates and prodromal UCs  Subjective: S/p epidural, resting   Objective: BP 108/62   Pulse 96   Temp 98.2 F (36.8 C)   Resp 16   Ht 5\' 7"  (1.702 m)   Wt 81.3 kg   LMP 06/22/2019   SpO2 98%   BMI 28.08 kg/m    FHT:  FHR: 130 bpm, variability: moderate,  accelerations:  Present,  decelerations:  Absent UC:   regular, every 3-5 minutes SVE:   Dilation: 4 Effacement (%): 70 Station: -2 Exam by:: Addi Pugh Repeat exam deferred  Labs: Lab Results  Component Value Date   WBC 10.1 03/23/2020   HGB 13.8 03/23/2020   HCT 41.4 03/23/2020   MCV 87.9 03/23/2020   PLT 255 03/23/2020    Assessment / Plan: Induction of labor due to term with favorable cervix,  progressing well on pitocin  Labor: Progressing normally Fetal Wellbeing:  Category I Pain Control:  Epidural I/D:  n/a Anticipated MOD:  NSVD  Anna Martin 03/23/2020, 3:51 PM

## 2020-03-23 NOTE — H&P (Signed)
Anna Martin is a 30 y.o. female presenting for IOL at 93 wks. EDC 03/28/20 G4W1027, 2 SVD- boys, largest 8'14"  and SABs x 2. Spontaneous pregnancy.  PNCare from 6 wks. Dating by 1st trim sono.  Complicated by anatomy sono noting Marginal cord, bilateral choroid plexus cysts and fetal pyelectasis at 19 wks, NIPS nl, XX.  saw MFM for consult, bilateral CPCs resolved, no other markers except fetal pyelectasis- left persisted, 14 mm at 35 wks, AGA growth, 6'2" at 78%, AFI nl.   OB History    Gravida  5   Para  2   Term  2   Preterm      AB  2   Living  2     SAB  2   IAB      Ectopic      Multiple  0   Live Births  1          Past Medical History:  Diagnosis Date  . Acute right flank pain 01/15/2014  . Headache   . Marginal insertion of umbilical cord 25/36/6440  . Tachycardia    not followed by cardiology at this time   Past Surgical History:  Procedure Laterality Date  . NO PAST SURGERIES     Family History: family history includes Diabetes in her maternal grandfather; Heart disease in her paternal grandfather; Hypertension in her maternal grandfather and maternal grandmother; Parkinsonism in her maternal grandmother. Social History:  reports that she has never smoked. She has never used smokeless tobacco. She reports that she does not drink alcohol and does not use drugs.     Maternal Diabetes: No Genetic Screening: Normal NIPS Maternal Ultrasounds/Referrals: Isolated choroid plexus cyst and Fetal renal pyelectasis. Choroid plexus cysts resolved at 21 wks when seen by MFM Fetal Ultrasounds or other Referrals:  Referred to Materal Fetal Medicine  Maternal Substance Abuse:  No2 Significant Maternal Medications:  None Significant Maternal Lab Results:  Group B Strep negative Other Comments:  None  Review of Systems History   Height 5\' 7"  (1.702 m), weight 81.3 kg, last menstrual period 06/22/2019, unknown if currently breastfeeding. Exam Physical  Exam  Physical exam:  A&O x 3, no acute distress. Pleasant HEENT neg, no thyromegaly Lungs CTA bilat CV RRR, S1S2 normal Abdo soft, non tender, non acute Extr no edema/ tenderness Pelvic 3/50%/-3 Vx. Intact membr FHT 140s + accels no decels mod variab cat I Toco irreg   Prenatal labs: ABO, Rh:  A(+) Antibody:  Neg Rubella:  Imm RPR:   NR HBsAg:   Neg HIV:   Neg GBS:   Neg Glucola nl  NIPS nl  Took Flu and TDAP vaccines. Declined Covid vaccine  Assessment/Plan: 30 yo G5P2022, 39 wks, IOL for dates. Prior SVDs x2 proven to 8'14". Now EFW 7.1/2 lbs FHT cat I Pitocin, epidural as needed. AROM when ready   Anna Martin 03/23/2020 =

## 2020-03-24 LAB — CBC
HCT: 34.1 % — ABNORMAL LOW (ref 36.0–46.0)
Hemoglobin: 11.8 g/dL — ABNORMAL LOW (ref 12.0–15.0)
MCH: 30.6 pg (ref 26.0–34.0)
MCHC: 34.6 g/dL (ref 30.0–36.0)
MCV: 88.3 fL (ref 80.0–100.0)
Platelets: 232 10*3/uL (ref 150–400)
RBC: 3.86 MIL/uL — ABNORMAL LOW (ref 3.87–5.11)
RDW: 13.7 % (ref 11.5–15.5)
WBC: 12.6 10*3/uL — ABNORMAL HIGH (ref 4.0–10.5)
nRBC: 0 % (ref 0.0–0.2)

## 2020-03-24 NOTE — Progress Notes (Signed)
CNM note correction  Correction A/P: PPD #1, [id number] -- Dr Benjie Karvonen

## 2020-03-24 NOTE — Anesthesia Postprocedure Evaluation (Signed)
Anesthesia Post Note  Patient: Anna Martin  Procedure(s) Performed: AN AD [place]     Patient location during evaluation: Mother Baby Anesthesia Type: Epidural Level of consciousness: awake, awake and alert and oriented Pain management: pain level controlled Vital Signs Assessment: post-procedure vital signs reviewed and stable Respiratory status: spontaneous breathing and respiratory function stable Cardiovascular status: blood pressure returned to baseline Postop Assessment: no headache, epidural receding, patient able to bend at knees, adequate PO intake, no backache, no apparent nausea or vomiting and able to ambulate Anesthetic complications: no   No complications documented.  Last Vitals:  Vitals:   03/24/20 0228 03/24/20 0536  BP: 106/73 110/68  Pulse: 84 88  Resp: 18 18  Temp: 36.9 C 36.7 C  SpO2: 100% 100%    Last Pain:  Vitals:   03/24/20 0536  TempSrc: Oral  PainSc:    Pain Goal:                   Bufford Spikes

## 2020-03-24 NOTE — Progress Notes (Signed)
PPD # 1 S/P NSVD  Live born female  Birth Weight: 8 lb 15.6 oz (4071 g) APGAR: 8, 9  Newborn Delivery   Birth date/time: 03/23/2020 18:33:00 Delivery type: Vaginal, Spontaneous     Baby name: Anna Martin  Delivering provider: MODY, VAISHALI   Episiotomy:None   Lacerations:None   Feeding: breast  Pain control at delivery: Epidural   Subjective   Reports feeling well overall. Having some cramping with breastfeeding that is relieved with Motrin. Reports mild tailbone and lower back pain.              Tolerating po/ No nausea or vomiting             Bleeding is light             Pain controlled with acetaminophen and ibuprofen (OTC)             Up ad lib / ambulatory / voiding without difficulties   Objective   A & O x 3, in no apparent distress              VS:  Vitals:   03/23/20 2133 03/23/20 2237 03/24/20 0228 03/24/20 0536  BP: 121/79 115/64 106/73 110/68  Pulse: (!) 114 98 84 88  Resp: 18 18 18 18   Temp: 98.1 F (36.7 C) 98.2 F (36.8 C) 98.4 F (36.9 C) 98.1 F (36.7 C)  TempSrc: Oral Oral Oral Oral  SpO2: 99% 99% 100% 100%  Weight:      Height:        LABS:  Recent Labs    03/23/20 0742 03/24/20 0431  WBC 10.1 12.6*  HGB 13.8 11.8*  HCT 41.4 34.1*  PLT 255 232     Blood type: --/--/A POS (01/31 0754)  Rubella: Immune (07/15 0000)   Vaccines:   TDaP   UTD                   Flu       UTD                             COVID-19 Declined   Gen: AAO x 3, NAD  Abdomen: soft, non-tender, non-distended             Fundus: firm, non-tender, U-1  Perineum: intact  Lochia: moderate  Extremities: no edema, no calf pain or tenderness   Assessment/Plan PPD # 1 30 y.o., [id number]   Principal Problem:   Postpartum care following vaginal delivery 1/31 Active Problems:   Encounter for elective induction of labor   SVD (spontaneous vaginal delivery) 1/31   Doing well - stable status  Routine post partum orders  Anticipate discharge tomorrow   Suzan Nailer, MSN, CNM 03/24/2020, 9:17 AM

## 2020-03-25 MED ORDER — ACETAMINOPHEN 325 MG PO TABS: 650.0000 mg | ORAL_TABLET | ORAL | 1 refills | Status: AC | PRN

## 2020-03-25 MED ORDER — IBUPROFEN 600 MG PO TABS: 600.0000 mg | ORAL_TABLET | Freq: Four times a day (QID) | ORAL | 0 refills | Status: DC

## 2020-03-25 NOTE — Discharge Summary (Signed)
OB Discharge Summary  Patient Name: Anna Martin DOB: 1990-05-04 MRN: [id number]  Date of admission: 03/23/2020 Delivering provider: MODY, VAISHALI   Admitting diagnosis: Encounter for elective induction of labor [Z34.90] SVD (spontaneous vaginal delivery) [O80] Intrauterine pregnancy: [redacted]w[redacted]d     Secondary diagnosis: Patient Active Problem List   Diagnosis Date Noted  . SVD (spontaneous vaginal delivery) 1/31 03/24/2020  . Postpartum care following vaginal delivery 1/31 03/24/2020  . Encounter for elective induction of labor 03/23/2020    Date of discharge: 03/25/2020   Discharge diagnosis: Principal Problem:   Postpartum care following vaginal delivery 1/31 Active Problems:   Encounter for elective induction of labor   SVD (spontaneous vaginal delivery) 1/31                                                            Post partum procedures:None  Augmentation: AROM and Pitocin Pain control: Epidural  Laceration:None  Episiotomy:None  Complications: None  Hospital course:  Induction of Labor With Vaginal Delivery   30 y.o. yo Q7H4193 at [redacted]w[redacted]d was admitted to the hospital 03/23/2020 for induction of labor.  Indication for induction: Favorable cervix at term.  Patient had an uncomplicated labor course as follows: Membrane Rupture Time/Date: 11:10 AM ,03/23/2020   Delivery Method:Vaginal, Spontaneous  Episiotomy: None  Lacerations:  None  Details of delivery can be found in separate delivery note.  Patient had a routine postpartum course. Patient is discharged home 03/25/20.  Newborn Data: Birth date:03/23/2020  Birth time:6:33 PM  Gender:Female  Living status:Living  Apgars:8 ,9  Weight:4071 g   Physical exam  Vitals:   03/24/20 0935 03/24/20 1300 03/24/20 2115 03/25/20 0617  BP: 124/70 111/71 (!) 104/59 106/62  Pulse: 91 90 72 76  Resp: 18 19 18 17   Temp: 98.2 F (36.8 C) 98.3 F (36.8 C) 98.1 F (36.7 C) 98 F (36.7 C)  TempSrc:  Oral Oral Oral  SpO2: 99% 100%  99% 99%  Weight:      Height:       General: alert, cooperative and no distress Lochia: appropriate Uterine Fundus: firm Perineum: intact DVT Evaluation: No evidence of DVT seen on physical exam. No significant calf/ankle edema. Labs: Lab Results  Component Value Date   WBC 12.6 (H) 03/24/2020   HGB 11.8 (L) 03/24/2020   HCT 34.1 (L) 03/24/2020   MCV 88.3 03/24/2020   PLT 232 03/24/2020   CMP Latest Ref Rng & Units 01/15/2014  Glucose 70 - 99 mg/dL 77  BUN 6 - 23 mg/dL 7  Creatinine 0.50 - 1.10 mg/dL 0.58  Sodium 137 - 147 mEq/L 136(L)  Potassium 3.7 - 5.3 mEq/L 3.6(L)  Chloride 96 - 112 mEq/L 100  CO2 19 - 32 mEq/L 23  Calcium 8.4 - 10.5 mg/dL 8.4   Edinburgh Postnatal Depression Scale Screening Tool 02/07/2018  I have been able to laugh and see the funny side of things. 0  I have looked forward with enjoyment to things. 0  I have blamed myself unnecessarily when things went wrong. 1  I have been anxious or worried for no good reason. 1  I have felt scared or panicky for no good reason. 1  Things have been getting on top of me. 0  I have been so unhappy that I have had difficulty  sleeping. 0  I have felt sad or miserable. 0  I have been so unhappy that I have been crying. 0  The thought of harming myself has occurred to me. 0  Edinburgh Postnatal Depression Scale Total 3    Vaccines: TDaP UTD         Flu    UTD         COVID-19   Declined  Discharge instructions:  per After Visit Summary  After Visit Meds:  Allergies as of 03/25/2020   No Known Allergies     Medication List    TAKE these medications   acetaminophen 325 MG tablet Commonly known as: Tylenol Take 2 tablets (650 mg total) by mouth every 4 (four) hours as needed (for pain scale < 4).   ibuprofen 600 MG tablet Commonly known as: ADVIL Take 1 tablet (600 mg total) by mouth every 6 (six) hours.   prenatal multivitamin Tabs tablet Take 1 tablet by mouth at bedtime.      Diet: routine  diet  Activity: Advance as tolerated. Pelvic rest for 6 weeks.   Newborn Data: Live born female  Birth Weight: 8 lb 15.6 oz (4071 g) APGAR: 8, 9  Newborn Delivery   Birth date/time: 03/[birth year]/2020 18:33:00 Delivery type: Vaginal, Spontaneous     Named [place] Baby Feeding: Breast Disposition:NICU  Delivery Report:  Review the Delivery Report for details.    Follow up:  Follow-up Information    Azucena Fallen, MD. Schedule an appointment as soon as possible for a visit in 6 week(s).   Specialty: Obstetrics and Gynecology Why: Please make an appointment for 6 weeks postpartum. Contact information: [place] [id number] [place], CNM, MSN 03/25/2020, 11:17 AM

## 2020-03-25 NOTE — Progress Notes (Signed)
Patient screened out for psychosocial assessment since none of the following apply:  Psychosocial stressors documented in mother or baby's chart  Gestation less than 32 weeks  Code at delivery   Infant with anomalies Please contact the Clinical Social Worker if specific needs arise, by MOB's request, or if MOB scores greater than 9/yes to question 10 on Edinburgh Postpartum Depression Screen.  Loyola Santino, LCSW Clinical Social Worker [facility] Cell#: [phone]     

## 2020-03-25 NOTE — Lactation Note (Signed)
This note was copied from a baby's chart. Lactation Consultation Note  Patient Name: Anna Martin Today's Date: 03/25/2020   Age:32 hours  LC spoke with NICU RN regarding mother's need for [facility] services. RN states mom is experienced and bf without difficulty. She will speak with mom and let LC know if services are needed.     Gwynne Edinger, MA IBCLC 03/25/2020, 2:18 PM

## 2020-03-29 IMAGING — US US PELVIS LIMITED
1 series · 15 of 21 positions shown · non-contrast
Comparison: None.

CLINICAL DATA: 27-year-old female status post vaginal delivery
earlier today with persistent vaginal bleeding.

EXAM:
LIMITED ULTRASOUND OF PELVIS
TECHNIQUE: Limited transabdominal ultrasound examination of the pelvis was
performed.

[Series 1: us pelvis limited · 15 of 21 slices shown]
[im 1/21]
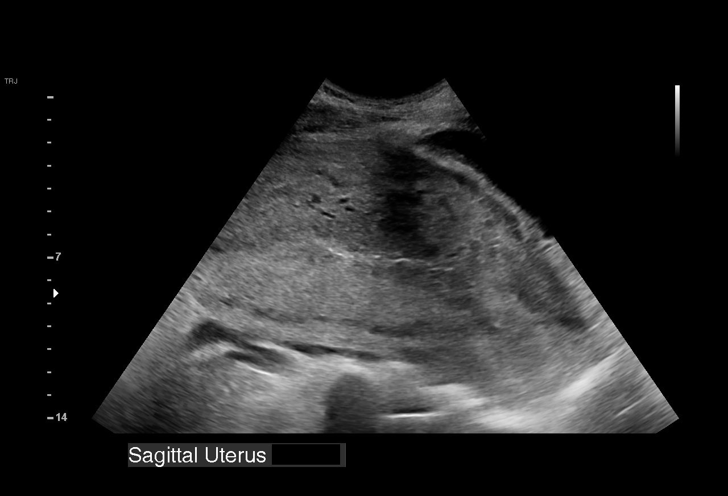
[im 3/21]
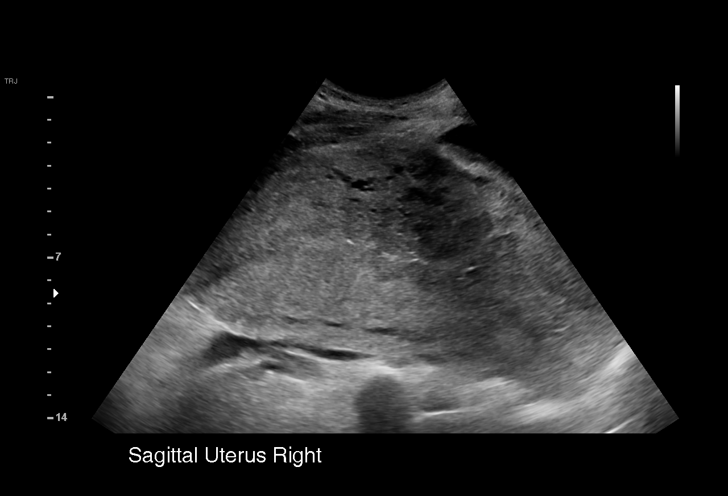
[im 4/21]
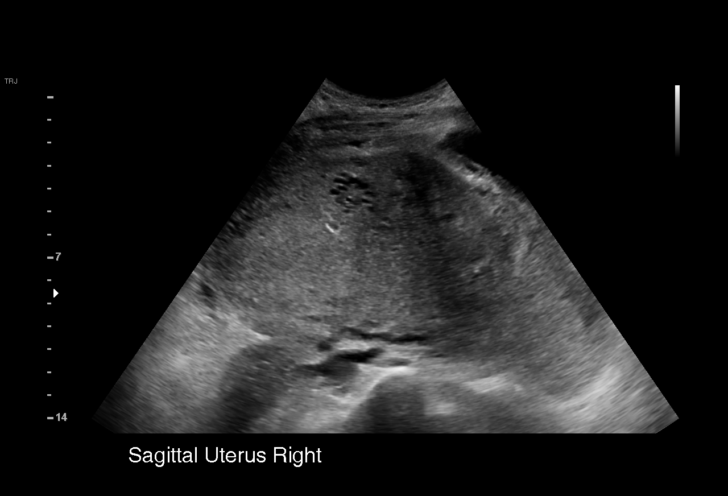
[im 5/21]
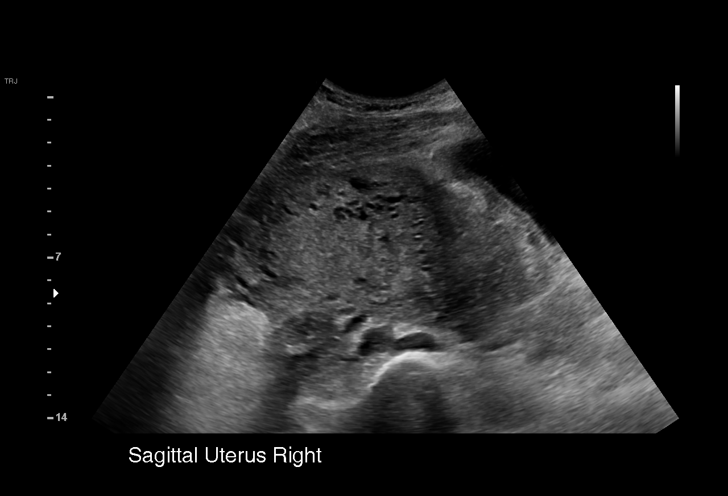
[im 7/21]
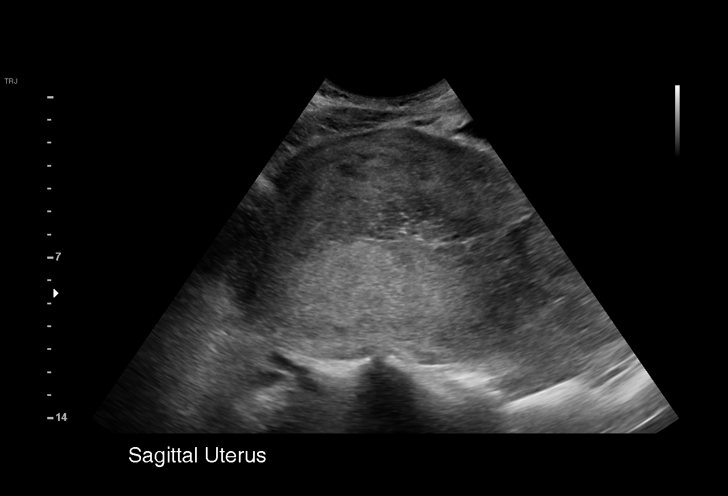
[im 8/21]
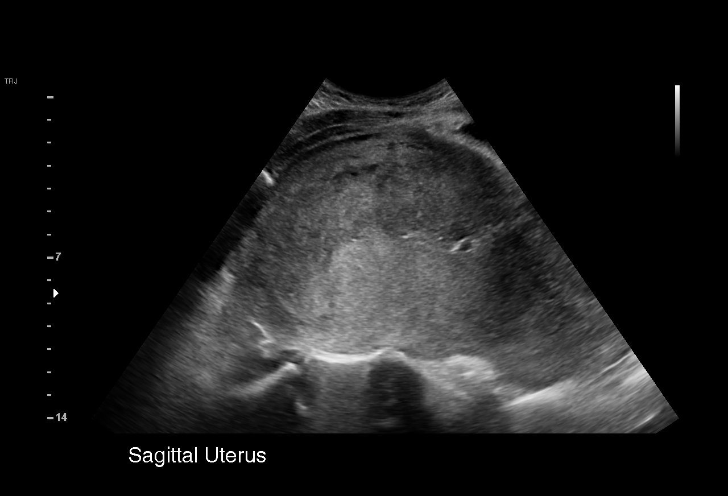
[im 10/21]
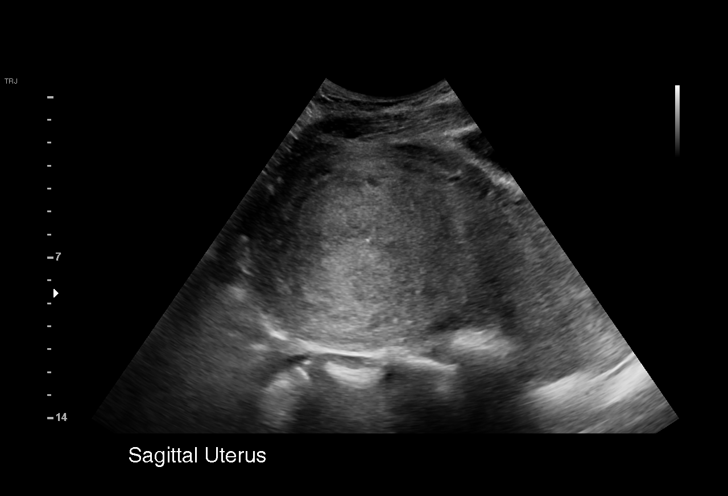
[im 11/21]
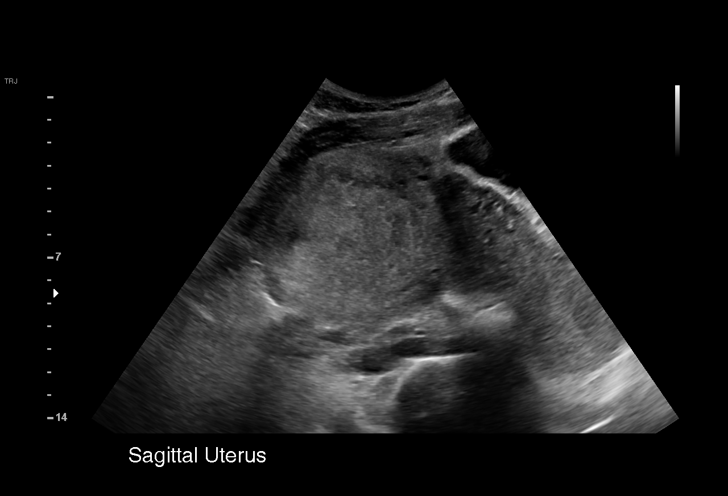
[im 12/21]
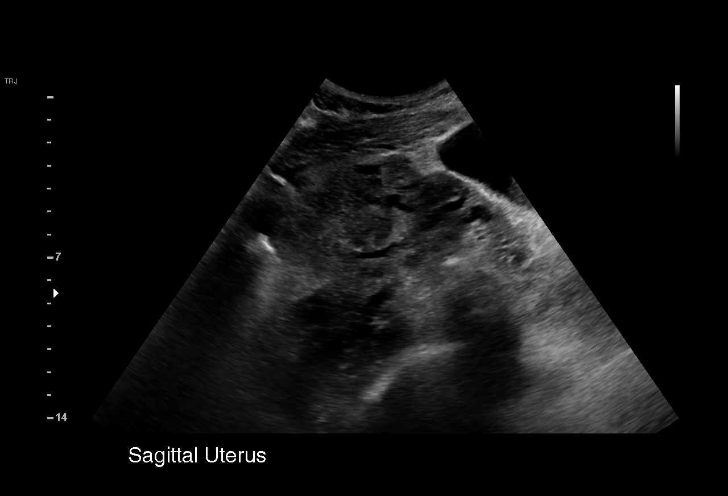
[im 14/21]
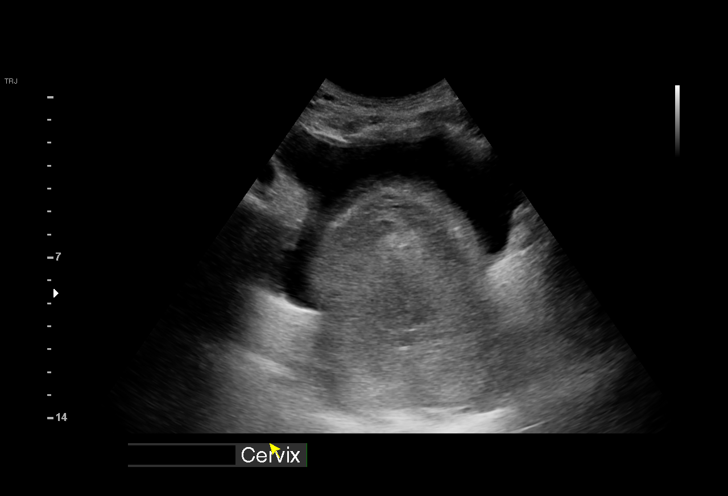
[im 15/21]
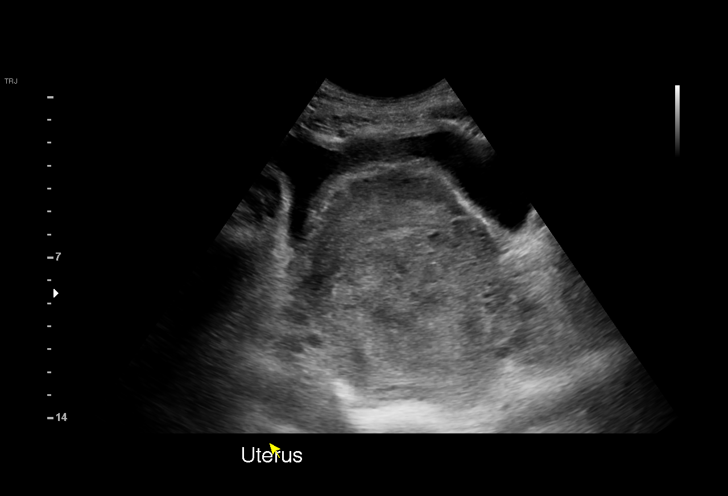
[im 17/21]
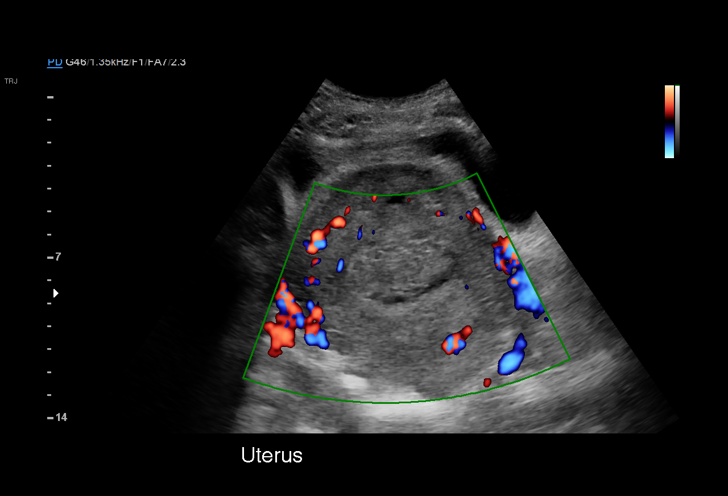
[im 18/21]
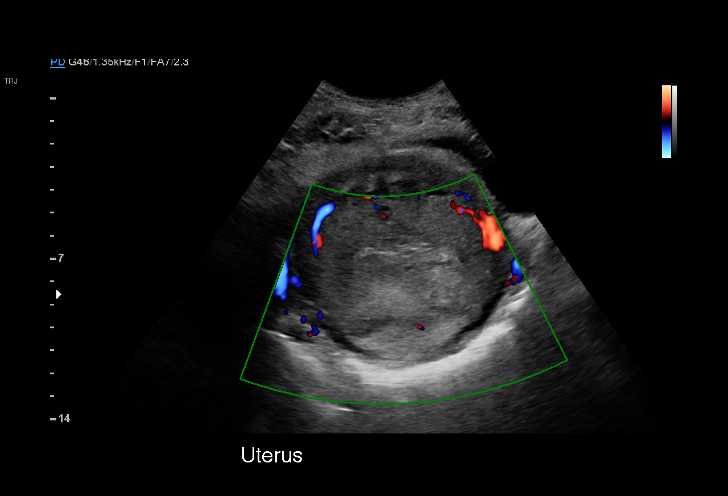
[im 19/21]
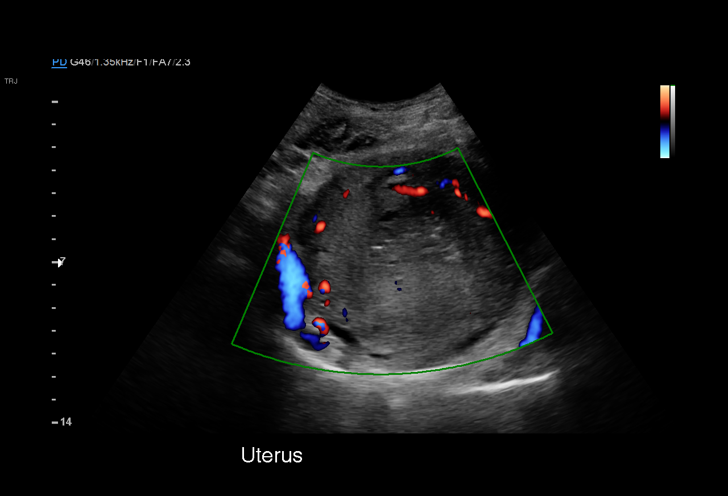
[im 21/21]
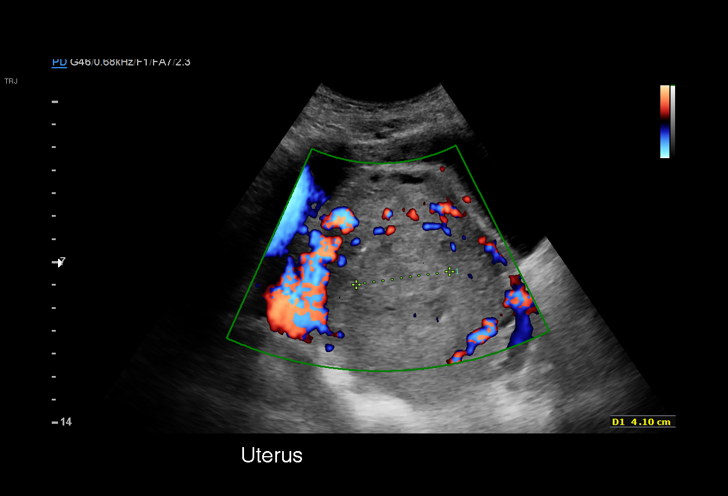

[15 of 21 positions shown; findings below may reference images not displayed]

FINDINGS: Transabdominal views of the enlarged postpartum uterus were
obtained. No uterine fibroids are demonstrated. There is a
hypoechoic 2.8 x 1.7 x 4.1 cm focus in the lower uterine cavity
without internal vascularity. No additional focal endometrial
findings. Endometrium otherwise appears thin, measuring
approximately 5 mm in bilayer thickness. No abnormal free fluid
demonstrated in the pelvis.
IMPRESSION: Hypoechoic 2.8 x 1.7 x 4.1 cm focus in the lower uterine cavity
without internal vascularity, favoring blood products, although the
differential includes retained products of conception. Follow-up
pelvic ultrasound may be obtained as clinically warranted.

These results were called by telephone at the time of interpretation
on 02/07/2018 at [DATE] to Dr. KLOOF MOGAR , who verbally
acknowledged these results.

## 2020-05-04 ENCOUNTER — Other Ambulatory Visit (HOSPITAL_COMMUNITY): Payer: Self-pay | Admitting: Obstetrics & Gynecology

## 2020-05-04 DIAGNOSIS — Z309 Encounter for contraceptive management, unspecified: Secondary | ICD-10-CM | POA: Diagnosis not present

## 2020-05-08 ENCOUNTER — Other Ambulatory Visit (HOSPITAL_COMMUNITY): Payer: Self-pay | Admitting: Obstetrics & Gynecology

## 2020-08-14 ENCOUNTER — Other Ambulatory Visit (HOSPITAL_COMMUNITY): Payer: Self-pay

## 2020-08-14 MED FILL — Norethindrone Tab 0.35 MG: ORAL | 84 days supply | Qty: 84 | Fill #0 | Status: AC

## 2020-12-24 DIAGNOSIS — D229 Melanocytic nevi, unspecified: Secondary | ICD-10-CM | POA: Diagnosis not present

## 2020-12-24 DIAGNOSIS — D2271 Melanocytic nevi of right lower limb, including hip: Secondary | ICD-10-CM | POA: Diagnosis not present

## 2020-12-24 DIAGNOSIS — L814 Other melanin hyperpigmentation: Secondary | ICD-10-CM | POA: Diagnosis not present

## 2020-12-24 DIAGNOSIS — L720 Epidermal cyst: Secondary | ICD-10-CM | POA: Diagnosis not present

## 2020-12-24 DIAGNOSIS — D1801 Hemangioma of skin and subcutaneous tissue: Secondary | ICD-10-CM | POA: Diagnosis not present

## 2020-12-24 DIAGNOSIS — Z23 Encounter for immunization: Secondary | ICD-10-CM | POA: Diagnosis not present

## 2020-12-24 DIAGNOSIS — D225 Melanocytic nevi of trunk: Secondary | ICD-10-CM | POA: Diagnosis not present

## 2020-12-24 DIAGNOSIS — L578 Other skin changes due to chronic exposure to nonionizing radiation: Secondary | ICD-10-CM | POA: Diagnosis not present

## 2021-01-22 ENCOUNTER — Other Ambulatory Visit: Payer: Self-pay

## 2021-01-22 MED ORDER — DICLOXACILLIN SODIUM 500 MG PO CAPS
ORAL_CAPSULE | ORAL | 0 refills | Status: DC
  Filled 2021-01-22: qty 40, 10d supply, fill #0

## 2021-03-07 DIAGNOSIS — R112 Nausea with vomiting, unspecified: Secondary | ICD-10-CM | POA: Diagnosis not present

## 2021-03-07 DIAGNOSIS — I959 Hypotension, unspecified: Secondary | ICD-10-CM | POA: Diagnosis not present

## 2021-03-09 ENCOUNTER — Other Ambulatory Visit (HOSPITAL_COMMUNITY): Payer: Self-pay

## 2021-03-09 MED ORDER — NORETHINDRONE 0.35 MG PO TABS
ORAL_TABLET | ORAL | 0 refills | Status: DC
  Filled 2021-03-09 – 2021-03-12 (×2): qty 84, 84d supply, fill #0

## 2021-03-12 ENCOUNTER — Other Ambulatory Visit: Payer: Self-pay

## 2021-03-12 ENCOUNTER — Other Ambulatory Visit (HOSPITAL_COMMUNITY): Payer: Self-pay

## 2021-03-28 ENCOUNTER — Emergency Department (INDEPENDENT_AMBULATORY_CARE_PROVIDER_SITE_OTHER)
Admission: EM | Admit: 2021-03-28 | Discharge: 2021-03-28 | Disposition: A | Discharge status: Home / Self Care | Payer: 59 | Source: Home / Self Care | Attending: Family Medicine | Admitting: Family Medicine

## 2021-03-28 ENCOUNTER — Encounter: Payer: Self-pay | Admitting: Emergency Medicine

## 2021-03-28 ENCOUNTER — Ambulatory Visit: Payer: Self-pay

## 2021-03-28 ENCOUNTER — Other Ambulatory Visit: Payer: Self-pay

## 2021-03-28 DIAGNOSIS — J020 Streptococcal pharyngitis: Secondary | ICD-10-CM | POA: Diagnosis not present

## 2021-03-28 LAB — POCT INFLUENZA A/B
Influenza A, POC: NEGATIVE
Influenza B, POC: NEGATIVE

## 2021-03-28 LAB — POCT RAPID STREP A (OFFICE): Rapid Strep A Screen: POSITIVE — AB

## 2021-03-28 LAB — POC SARS CORONAVIRUS 2 AG -  ED: SARS Coronavirus 2 Ag: NEGATIVE

## 2021-03-28 MED ORDER — PENICILLIN V POTASSIUM 500 MG PO TABS: 500.0000 mg | ORAL_TABLET | Freq: Two times a day (BID) | ORAL | 0 refills | Status: AC

## 2021-03-28 NOTE — ED Provider Notes (Signed)
[facility]    CSN: [id number] Arrival date & time: 03/28/21  0850      History   Chief Complaint Chief Complaint  Patient presents with   Sore Throat    HPI Anna Martin is a 31 y.o. female.   HPI Patient started yesterday with sore throat fever chills body aches and fatigue.  She states it is painful to swallow.  Painful to talk.  She states the glands in her neck are very swollen.  She has 3 children age 44 3 and 1.  The 70-year-old threw up yesterday but otherwise they all appear to be well.  She did a COVID test at home yesterday, same day symptoms started, it was negative. Past Medical History:  Diagnosis Date   Acute right flank pain 01/15/2014   Headache    Marginal insertion of umbilical cord 49/44/9675   Tachycardia    not followed by cardiology at this time    Patient Active Problem List   Diagnosis Date Noted   SVD (spontaneous vaginal delivery) 1/31 03/24/2020   Postpartum care following vaginal delivery 1/31 03/24/2020   Encounter for elective induction of labor 03/23/2020    Past Surgical History:  Procedure Laterality Date   NO PAST SURGERIES      OB History     Gravida  5   Para  2   Term  2   Preterm      AB  2   Living  2      SAB  2   IAB      Ectopic      Multiple  0   Live Births  1            Home Medications    Prior to Admission medications   Medication Sig Start Date End Date Taking? Authorizing Provider  norethindrone (MICRONOR) 0.35 MG tablet TAKE 1 TABLET BY MOUTH ONCE A DAY 05/04/20 05/04/21 Yes Azucena Fallen, MD  penicillin v potassium (VEETID) 500 MG tablet Take 1 tablet (500 mg total) by mouth 2 (two) times daily for 10 days. 03/28/21 04/07/21 Yes Raylene Everts, MD  Prenatal Vit-Fe Fumarate-FA (PRENATAL MULTIVITAMIN) TABS tablet Take 1 tablet by mouth at bedtime.   Yes [provider]  acetaminophen (TYLENOL) 325 MG tablet Take 2 tablets (650 mg total) by mouth every 4 (four)  hours as needed (for pain scale < 4). 03/25/20   Suzan Nailer, CNM  ibuprofen (ADVIL) 600 MG tablet Take 1 tablet (600 mg total) by mouth every 6 (six) hours. 03/25/20   Suzan Nailer, CNM  norethindrone (MICRONOR) 0.35 MG tablet Take 1 tablet by mouth once daily 03/09/21       Family History Family History  Problem Relation Age of Onset   Hypertension Maternal Grandmother    Parkinsonism Maternal Grandmother    Hypertension Maternal Grandfather    Diabetes Maternal Grandfather    Heart disease Paternal Grandfather     Social History Social History   Tobacco Use   Smoking status: Never   Smokeless tobacco: Never  Vaping Use   Vaping Use: Never used  Substance Use Topics   Alcohol use: No   Drug use: No     Allergies   Patient has no known allergies.   Review of Systems Review of Systems  See HPI Physical Exam Triage Vital Signs ED Triage Vitals  Enc Vitals Group     BP 03/28/21 0930 111/78     Pulse  Rate 03/28/21 0930 (!) 126     Resp 03/28/21 0930 18     Temp 03/28/21 0930 99.1 F (37.3 C)     Temp Source 03/28/21 0930 Oral     SpO2 03/28/21 0930 97 %     Weight --      Height --      Head Circumference --      Peak Flow --      Pain Score 03/28/21 0928 8     Pain Loc --      Pain Edu? --      Excl. in [place]? --    No data found.  Updated Vital Signs BP 111/78 (BP Location: Left Arm)    Pulse (!) 126    Temp 99.1 F (37.3 C) (Oral)    Resp 18    SpO2 97%    Breastfeeding Yes      Physical Exam Constitutional:      General: She is not in acute distress.    Appearance: She is well-developed and normal weight.  HENT:     Head: Normocephalic and atraumatic.     Right Ear: Tympanic membrane and ear canal normal.     Left Ear: Tympanic membrane and ear canal normal.     Nose: No congestion.     Mouth/Throat:     Mouth: Mucous membranes are moist.     Pharynx: Pharyngeal swelling and posterior oropharyngeal erythema present.     Tonsils: Tonsillar  exudate present. 2+ on the right. 2+ on the left.  Eyes:     Conjunctiva/sclera: Conjunctivae normal.     Pupils: Pupils are equal, round, and reactive to light.  Cardiovascular:     Rate and Rhythm: Normal rate.  Pulmonary:     Effort: Pulmonary effort is normal. No respiratory distress.  Abdominal:     General: There is no distension.     Palpations: Abdomen is soft.  Musculoskeletal:        General: Normal range of motion.     Cervical back: Normal range of motion.  Lymphadenopathy:     Cervical: Cervical adenopathy present.  Skin:    General: Skin is warm and dry.  Neurological:     General: No focal deficit present.     Mental Status: She is alert.  Psychiatric:        Mood and Affect: Mood normal.     UC Treatments / Results  Labs (all labs ordered are listed, but only abnormal results are displayed) Labs Reviewed  POCT RAPID STREP A (OFFICE) - Abnormal; Notable for the following components:      Result Value   Rapid Strep A Screen Positive (*)    All other components within normal limits  POC SARS CORONAVIRUS 2 AG -  ED - Normal  POCT INFLUENZA A/B - Normal    EKG   Radiology No results found.  Procedures Procedures (including critical care time)  Medications Ordered in UC Medications - No data to display  Initial Impression / Assessment and Plan / UC Course  I have reviewed the triage vital signs and the nursing notes.  Pertinent labs & imaging results that were available during my care of the patient were reviewed by me and considered in my medical decision making (see chart for details).     Strep test is positive Final Clinical Impressions(s) / UC Diagnoses   Final diagnoses:  Strep throat     Discharge Instructions      Take  penicillin 2 times a day for 10 full days Tylenol, ibuprofen or naproxen for pain and fever May use sore throat sprays or lozenges as needed      ED Prescriptions     Medication Sig Dispense Auth. Provider    penicillin v potassium (VEETID) 500 MG tablet Take 1 tablet (500 mg total) by mouth 2 (two) times daily for 10 days. 20 tablet Raylene Everts, MD      PDMP not reviewed this encounter.   Raylene Everts, MD 03/28/21 [phone]

## 2021-03-28 NOTE — ED Triage Notes (Signed)
Patient presents to Urgent Care with complaints of sore throat, dysphagia since 1 day ago. Patient reports body aches, chills, headache, scratchy throat. Did have a negative home covid test. She does breast feed. Decrease appetite, fatigued. Took ibuprofen last night-helped with headache.

## 2021-03-28 NOTE — Discharge Instructions (Signed)
Take penicillin 2 times a day for 10 full days Tylenol, ibuprofen or naproxen for pain and fever May use sore throat sprays or lozenges as needed

## 2021-04-26 DIAGNOSIS — Z124 Encounter for screening for malignant neoplasm of cervix: Secondary | ICD-10-CM | POA: Diagnosis not present

## 2021-04-26 DIAGNOSIS — Z01419 Encounter for gynecological examination (general) (routine) without abnormal findings: Secondary | ICD-10-CM | POA: Diagnosis not present

## 2021-04-26 DIAGNOSIS — Z113 Encounter for screening for infections with a predominantly sexual mode of transmission: Secondary | ICD-10-CM | POA: Diagnosis not present

## 2021-04-26 DIAGNOSIS — Z01411 Encounter for gynecological examination (general) (routine) with abnormal findings: Secondary | ICD-10-CM | POA: Diagnosis not present

## 2021-04-27 ENCOUNTER — Other Ambulatory Visit: Payer: Self-pay

## 2021-04-27 MED ORDER — NORETHINDRONE 0.35 MG PO TABS
ORAL_TABLET | ORAL | 3 refills | Status: DC
  Filled 2021-04-27 – 2021-06-03 (×2): qty 84, 84d supply, fill #0

## 2021-05-19 DIAGNOSIS — Z13220 Encounter for screening for lipoid disorders: Secondary | ICD-10-CM | POA: Diagnosis not present

## 2021-05-19 DIAGNOSIS — Z7689 Persons encountering health services in other specified circumstances: Secondary | ICD-10-CM | POA: Diagnosis not present

## 2021-05-19 DIAGNOSIS — R1030 Lower abdominal pain, unspecified: Secondary | ICD-10-CM | POA: Diagnosis not present

## 2021-05-19 DIAGNOSIS — R634 Abnormal weight loss: Secondary | ICD-10-CM | POA: Diagnosis not present

## 2021-05-19 DIAGNOSIS — Z1329 Encounter for screening for other suspected endocrine disorder: Secondary | ICD-10-CM | POA: Diagnosis not present

## 2021-05-20 DIAGNOSIS — R634 Abnormal weight loss: Secondary | ICD-10-CM | POA: Diagnosis not present

## 2021-05-20 DIAGNOSIS — R1030 Lower abdominal pain, unspecified: Secondary | ICD-10-CM | POA: Diagnosis not present

## 2021-06-03 ENCOUNTER — Other Ambulatory Visit (HOSPITAL_COMMUNITY): Payer: Self-pay

## 2021-07-08 ENCOUNTER — Other Ambulatory Visit: Payer: Self-pay

## 2021-07-08 DIAGNOSIS — K529 Noninfective gastroenteritis and colitis, unspecified: Secondary | ICD-10-CM | POA: Diagnosis not present

## 2021-07-08 MED ORDER — DICYCLOMINE HCL 10 MG PO CAPS
ORAL_CAPSULE | ORAL | 1 refills | Status: DC
  Filled 2021-07-08: qty 30, 10d supply, fill #0

## 2021-07-21 DIAGNOSIS — Z760 Encounter for issue of repeat prescription: Secondary | ICD-10-CM | POA: Diagnosis not present

## 2021-07-30 ENCOUNTER — Other Ambulatory Visit: Payer: Self-pay

## 2021-09-14 ENCOUNTER — Other Ambulatory Visit: Payer: Self-pay

## 2021-09-14 MED ORDER — SLYND 4 MG PO TABS
ORAL_TABLET | ORAL | 2 refills | Status: DC
  Filled 2021-09-14: qty 28, 28d supply, fill #0
  Filled 2021-10-27: qty 28, 28d supply, fill #1
  Filled 2021-10-28: qty 56, 56d supply, fill #0

## 2021-10-27 ENCOUNTER — Other Ambulatory Visit: Payer: Self-pay

## 2021-10-28 ENCOUNTER — Other Ambulatory Visit (HOSPITAL_COMMUNITY): Payer: Self-pay

## 2021-11-10 DIAGNOSIS — M542 Cervicalgia: Secondary | ICD-10-CM | POA: Diagnosis not present

## 2021-11-10 DIAGNOSIS — S199XXA Unspecified injury of neck, initial encounter: Secondary | ICD-10-CM | POA: Diagnosis not present

## 2021-11-29 DIAGNOSIS — M542 Cervicalgia: Secondary | ICD-10-CM | POA: Diagnosis not present

## 2021-12-23 ENCOUNTER — Other Ambulatory Visit: Payer: Self-pay

## 2021-12-23 MED ORDER — SLYND 4 MG PO TABS
1.0000 | ORAL_TABLET | Freq: Every day | ORAL | 4 refills | Status: DC
  Filled 2021-12-23: qty 84, 84d supply, fill #0
  Filled 2021-12-23: qty 28, 28d supply, fill #0

## 2021-12-27 DIAGNOSIS — L578 Other skin changes due to chronic exposure to nonionizing radiation: Secondary | ICD-10-CM | POA: Diagnosis not present

## 2021-12-27 DIAGNOSIS — D1801 Hemangioma of skin and subcutaneous tissue: Secondary | ICD-10-CM | POA: Diagnosis not present

## 2021-12-27 DIAGNOSIS — L814 Other melanin hyperpigmentation: Secondary | ICD-10-CM | POA: Diagnosis not present

## 2022-02-23 ENCOUNTER — Other Ambulatory Visit (HOSPITAL_COMMUNITY): Payer: Self-pay

## 2022-02-28 ENCOUNTER — Other Ambulatory Visit: Payer: Self-pay

## 2022-02-28 ENCOUNTER — Other Ambulatory Visit (HOSPITAL_COMMUNITY): Payer: Self-pay

## 2023-02-22 NOTE — L&D Delivery Note (Signed)
 Operative Delivery Note Progressed quickly from 6 to 10 cm.  She pushed well for < 10 minutes. At 5:27 PM a viable female was delivered via Vaginal, Spontaneous.  Presentation: vertex; Position: Left,, Occiput,, Anterior.  The head was slow to deliver and did not easily restitute.  Following delivery of the head, the shoulders did not easily deliver.  The patient was instructed to stop pushing. On exam, the shoulders were more oblique to transverse, so suprapubic pressure and McRoberts proved ineffective.  With exam, the left (posterior) axilla was grasped and shoulder shrug performed to lead to shoulder rotation and delivery of the shoulder and body.   Delivery of the head: 10/10/2023  5:25 PM First maneuver: 10/10/2023  5:26 PM, McRoberts Second maneuver: 10/10/2023  5:26 PM, Suprapubic Pressure Third maneuver: Shoulder shrug (posterior left shoulder)  Total time of shoulder dystocia was approximately 80 seconds.  Infant received some BBO2, but was skin to skin with mom before 10 minutes of life.  She was moving all upper extremities.   With second fundal rub, there were a couple of large gushes of blood.  Sweep revealed small clots in the LUS.  A dose of TXA was given and bleeding lessened quickly  APGAR: 5, 9; weight 9 lb 14 oz (4480 g).   Placenta status: Spontaneous, in tact, .   Cord:3V  with the following complications: None.  Cord pH: n/a  Anesthesia:  Epidural Episiotomy: None Lacerations: None Suture Repair: n/a Est. Blood Loss (mL):  442 mL  Mom to postpartum.  Baby to Couplet care / Skin to Skin.  [facility] GEFFEL Anna Martin 10/10/2023, 5:51 PM

## 2023-03-28 LAB — OB RESULTS CONSOLE GC/CHLAMYDIA
Chlamydia: NEGATIVE
Neisseria Gonorrhea: NEGATIVE

## 2023-03-28 LAB — OB RESULTS CONSOLE HEPATITIS B SURFACE ANTIGEN: Hepatitis B Surface Ag: NEGATIVE

## 2023-03-28 LAB — HEPATITIS C ANTIBODY: HCV Ab: NEGATIVE

## 2023-03-28 LAB — OB RESULTS CONSOLE VARICELLA ZOSTER ANTIBODY, IGG: Varicella: IMMUNE

## 2023-03-28 LAB — OB RESULTS CONSOLE HIV ANTIBODY (ROUTINE TESTING): HIV: NONREACTIVE

## 2023-03-28 LAB — OB RESULTS CONSOLE RPR: RPR: NONREACTIVE

## 2023-07-26 LAB — OB RESULTS CONSOLE RPR: RPR: NONREACTIVE

## 2023-09-20 LAB — OB RESULTS CONSOLE GBS: GBS: NEGATIVE

## 2023-10-10 ENCOUNTER — Encounter (HOSPITAL_COMMUNITY): Payer: Self-pay | Admitting: Obstetrics

## 2023-10-10 ENCOUNTER — Inpatient Hospital Stay (HOSPITAL_COMMUNITY): Admitting: Anesthesiology

## 2023-10-10 ENCOUNTER — Other Ambulatory Visit: Payer: Self-pay

## 2023-10-10 DIAGNOSIS — Z8249 Family history of ischemic heart disease and other diseases of the circulatory system: Secondary | ICD-10-CM | POA: Diagnosis not present

## 2023-10-10 DIAGNOSIS — Z833 Family history of diabetes mellitus: Secondary | ICD-10-CM

## 2023-10-10 DIAGNOSIS — Z3A39 39 weeks gestation of pregnancy: Secondary | ICD-10-CM | POA: Diagnosis not present

## 2023-10-10 DIAGNOSIS — O26893 Other specified pregnancy related conditions, third trimester: Secondary | ICD-10-CM | POA: Diagnosis present

## 2023-10-10 LAB — TYPE AND SCREEN
ABO/RH(D): A POS
Antibody Screen: NEGATIVE

## 2023-10-10 LAB — CBC
HCT: 38.7 % (ref 36.0–46.0)
Hemoglobin: 13.2 g/dL (ref 12.0–15.0)
MCH: 30 pg (ref 26.0–34.0)
MCHC: 34.1 g/dL (ref 30.0–36.0)
MCV: 88 fL (ref 80.0–100.0)
Platelets: 200 K/uL (ref 150–400)
RBC: 4.4 MIL/uL (ref 3.87–5.11)
RDW: 13.3 % (ref 11.5–15.5)
WBC: 9.9 K/uL (ref 4.0–10.5)
nRBC: 0 % (ref 0.0–0.2)

## 2023-10-10 LAB — RPR: RPR Ser Ql: NONREACTIVE

## 2023-10-10 MED ORDER — IBUPROFEN 600 MG PO TABS
600.0000 mg | ORAL_TABLET | Freq: Four times a day (QID) | ORAL | Status: DC
  Administered 2023-10-10 – 2023-10-11 (×4): 600 mg via ORAL
  Filled 2023-10-10 (×4): qty 1

## 2023-10-10 MED ORDER — ONDANSETRON HCL 4 MG/2ML IJ SOLN
4.0000 mg | Freq: Four times a day (QID) | INTRAMUSCULAR | Status: DC | PRN
Start: 2023-10-10 — End: 2023-10-10

## 2023-10-10 MED ORDER — ACETAMINOPHEN 325 MG PO TABS: 650.0000 mg | ORAL_TABLET | ORAL | Status: DC | PRN

## 2023-10-10 MED ORDER — DIPHENHYDRAMINE HCL 25 MG PO CAPS: 25.0000 mg | ORAL_CAPSULE | Freq: Four times a day (QID) | ORAL | Status: DC | PRN

## 2023-10-10 MED ORDER — OXYCODONE-ACETAMINOPHEN 5-325 MG PO TABS: 2.0000 | ORAL_TABLET | ORAL | Status: DC | PRN

## 2023-10-10 MED ORDER — OXYTOCIN-SODIUM CHLORIDE 30-0.9 UT/500ML-% IV SOLN
2.5000 [IU]/h | INTRAVENOUS | Status: DC
  Administered 2023-10-10: 2.5 [IU]/h via INTRAVENOUS

## 2023-10-10 MED ORDER — PHENYLEPHRINE 80 MCG/ML (10ML) SYRINGE FOR IV PUSH (FOR BLOOD PRESSURE SUPPORT): 80.0000 ug | PREFILLED_SYRINGE | INTRAVENOUS | Status: DC | PRN

## 2023-10-10 MED ORDER — EPHEDRINE 5 MG/ML INJ: 10.0000 mg | INTRAVENOUS | Status: DC | PRN

## 2023-10-10 MED ORDER — LACTATED RINGERS IV SOLN
500.0000 mL | Freq: Once | INTRAVENOUS | Status: AC
  Administered 2023-10-10: 500 mL via INTRAVENOUS

## 2023-10-10 MED ORDER — SENNOSIDES-DOCUSATE SODIUM 8.6-50 MG PO TABS
2.0000 | ORAL_TABLET | ORAL | Status: DC
  Filled 2023-10-10: qty 2

## 2023-10-10 MED ORDER — FENTANYL CITRATE (PF) 100 MCG/2ML IJ SOLN: 50.0000 ug | INTRAMUSCULAR | Status: DC | PRN

## 2023-10-10 MED ORDER — TRANEXAMIC ACID-NACL 1000-0.7 MG/100ML-% IV SOLN
INTRAVENOUS | Status: AC
Start: 2023-10-10 — End: 2023-10-10
  Administered 2023-10-10: 1000 mg
  Filled 2023-10-10: qty 100

## 2023-10-10 MED ORDER — LACTATED RINGERS IV SOLN: INTRAVENOUS | Status: DC

## 2023-10-10 MED ORDER — DIPHENHYDRAMINE HCL 50 MG/ML IJ SOLN: 12.5000 mg | INTRAMUSCULAR | Status: DC | PRN

## 2023-10-10 MED ORDER — LACTATED RINGERS IV SOLN: 500.0000 mL | INTRAVENOUS | Status: DC | PRN

## 2023-10-10 MED ORDER — BENZOCAINE-MENTHOL 20-0.5 % EX AERO: 1.0000 | INHALATION_SPRAY | CUTANEOUS | Status: DC | PRN

## 2023-10-10 MED ORDER — ONDANSETRON HCL 4 MG PO TABS: 4.0000 mg | ORAL_TABLET | ORAL | Status: DC | PRN

## 2023-10-10 MED ORDER — DIBUCAINE (PERIANAL) 1 % EX OINT: 1.0000 | TOPICAL_OINTMENT | CUTANEOUS | Status: DC | PRN

## 2023-10-10 MED ORDER — PRENATAL MULTIVITAMIN CH
1.0000 | ORAL_TABLET | Freq: Every day | ORAL | Status: DC
Start: 2023-10-11 — End: 2023-10-12
  Administered 2023-10-11: 1 via ORAL
  Filled 2023-10-10: qty 1

## 2023-10-10 MED ORDER — OXYCODONE HCL 5 MG PO TABS: 10.0000 mg | ORAL_TABLET | ORAL | Status: DC | PRN

## 2023-10-10 MED ORDER — TERBUTALINE SULFATE 1 MG/ML IJ SOLN: 0.2500 mg | Freq: Once | INTRAMUSCULAR | Status: DC | PRN

## 2023-10-10 MED ORDER — SOD CITRATE-CITRIC ACID 500-334 MG/5ML PO SOLN
30.0000 mL | ORAL | Status: DC | PRN
Start: 2023-10-10 — End: 2023-10-10

## 2023-10-10 MED ORDER — LIDOCAINE HCL (PF) 1 % IJ SOLN
INTRAMUSCULAR | Status: DC | PRN
  Administered 2023-10-10: 11 mL via EPIDURAL

## 2023-10-10 MED ORDER — OXYCODONE-ACETAMINOPHEN 5-325 MG PO TABS: 1.0000 | ORAL_TABLET | ORAL | Status: DC | PRN

## 2023-10-10 MED ORDER — COCONUT OIL OIL
1.0000 | TOPICAL_OIL | Status: DC | PRN
Start: 2023-10-10 — End: 2023-10-12

## 2023-10-10 MED ORDER — SIMETHICONE 80 MG PO CHEW: 80.0000 mg | CHEWABLE_TABLET | ORAL | Status: DC | PRN

## 2023-10-10 MED ORDER — TETANUS-DIPHTH-ACELL PERTUSSIS 5-2.5-18.5 LF-MCG/0.5 IM SUSY: 0.5000 mL | PREFILLED_SYRINGE | Freq: Once | INTRAMUSCULAR | Status: DC

## 2023-10-10 MED ORDER — WITCH HAZEL-GLYCERIN EX PADS: 1.0000 | MEDICATED_PAD | CUTANEOUS | Status: DC | PRN

## 2023-10-10 MED ORDER — ONDANSETRON HCL 4 MG/2ML IJ SOLN: 4.0000 mg | INTRAMUSCULAR | Status: DC | PRN

## 2023-10-10 MED ORDER — OXYTOCIN BOLUS FROM INFUSION
333.0000 mL | Freq: Once | INTRAVENOUS | Status: AC
  Administered 2023-10-10: 333 mL via INTRAVENOUS

## 2023-10-10 MED ORDER — LIDOCAINE HCL (PF) 1 % IJ SOLN: 30.0000 mL | INTRAMUSCULAR | Status: DC | PRN

## 2023-10-10 MED ORDER — OXYCODONE HCL 5 MG PO TABS: 5.0000 mg | ORAL_TABLET | ORAL | Status: DC | PRN

## 2023-10-10 MED ORDER — OXYTOCIN-SODIUM CHLORIDE 30-0.9 UT/500ML-% IV SOLN
1.0000 m[IU]/min | INTRAVENOUS | Status: DC
  Administered 2023-10-10: 2 m[IU]/min via INTRAVENOUS
  Filled 2023-10-10: qty 500

## 2023-10-10 MED ORDER — FENTANYL-BUPIVACAINE-NACL 0.5-0.125-0.9 MG/250ML-% EP SOLN
12.0000 mL/h | EPIDURAL | Status: DC | PRN
  Administered 2023-10-10: 12 mL/h via EPIDURAL
  Filled 2023-10-10: qty 250

## 2023-10-10 NOTE — Progress Notes (Signed)
 Comfortable with epidural  BP (!) 110/53   Pulse 94   Temp 98.8 F (37.1 C) (Oral)   Resp 18   Ht 5' 7 (1.702 m)   Wt 85.3 kg   SpO2 98%   BMI 29.44 kg/m   Toco: q2-3 minutes EFM: 140s,. Moderate variability, + accelerations category 1 SVE: 5/50/-2, AROM large clear fluid  A/P: H4E6896 @ [redacted]w[redacted]d here for EIOL  EIOL: on pitocin , s/p AROM.  Anticipate SVD.  Pelvis proven to 8lb 15oz GBS negative

## 2023-10-10 NOTE — Progress Notes (Signed)
 Comfortable with epidural  BP 118/71   Pulse (!) 104   Temp 98.2 F (36.8 C) (Oral)   Resp 18   Ht 5' 7 (1.702 m)   Wt 85.3 kg   SpO2 98%   BMI 29.44 kg/m   Toco: q2-3 minutes EFM: 150s,occ early variable with contraction, moderate variability, + accelerations category 1 SVE: unchanged 5/50/-2  A/P: H4E6896 @ [redacted]w[redacted]d here for EIOL  EIOL: on pitocin , s/p AROM, still in latent labor.  Continue to titrate pitocin . Anticipate SVD.  Pelvis proven to 8lb 15oz GBS negative

## 2023-10-10 NOTE — Anesthesia Procedure Notes (Signed)
 Epidural Patient location during procedure: OB Start time: 10/10/2023 9:15 AM End time: 10/10/2023 9:27 AM  Staffing Anesthesiologist: Cleotilde Butler Dade, MD Performed: anesthesiologist   Preanesthetic Checklist Completed: patient identified, IV checked, site marked, risks and benefits discussed, surgical consent, monitors and equipment checked, pre-op evaluation and timeout performed  Epidural Patient position: sitting Prep: ChloraPrep Patient monitoring: heart rate, cardiac monitor, continuous pulse ox and blood pressure Approach: midline Location: L2-L3 Injection technique: LOR saline  Needle:  Needle type: Tuohy  Needle gauge: 17 G Needle length: 9 cm Needle insertion depth: 5 cm Catheter type: closed end flexible Catheter size: 20 Guage Catheter at skin depth: 9 cm Test dose: negative  Assessment Events: blood not aspirated, injection not painful, no injection resistance, no paresthesia and negative IV test  Additional Notes Reason for block:procedure for pain

## 2023-10-10 NOTE — H&P (Signed)
 Anna Martin is a 33 y.o.  [id number] female presenting at 65 1/7wks for elective IOL.  Prenatal care has been complicated only by placental lake noted in mid portion, adjacent to cord insertion site. Growth scans have shown AGA.  GBS neg. NIPT expected range.  OB History     Gravida  5   Para  3   Term  3   Preterm      AB  1   Living  3      SAB  1   IAB      Ectopic      Multiple  0   Live Births  3          Past Medical History:  Diagnosis Date   Acute right flank pain 01/15/2014   Headache    Marginal insertion of umbilical cord 01/15/2014   Tachycardia    not followed by cardiology at this time   Past Surgical History:  Procedure Laterality Date   NO PAST SURGERIES     Family History: family history includes Diabetes in her maternal grandfather; Heart disease in her paternal grandfather; Hypertension in her maternal grandfather and maternal grandmother; Parkinsonism in her maternal grandmother. Social History:  reports that she has never smoked. She has never used smokeless tobacco. She reports that she does not drink alcohol and does not use drugs.     Maternal Diabetes: No Genetic Screening: Normal Maternal Ultrasounds/Referrals: Other:see HPI  Fetal Ultrasounds or other Referrals:  Referred to Materal Fetal Medicine  Maternal Substance Abuse:  No Significant Maternal Medications:  None Significant Maternal Lab Results:  Group B Strep negative Number of Prenatal Visits:greater than 3 verified prenatal visits Maternal Vaccinations:TDap Other Comments:  None  Review of Systems  Constitutional:  Negative for activity change, diaphoresis and fatigue.  Eyes:  Negative for photophobia and visual disturbance.  Respiratory:  Negative for chest tightness and shortness of breath.   Gastrointestinal:  Negative for abdominal pain.  Genitourinary:  Positive for pelvic pain.  Musculoskeletal:  Negative for back pain.  Neurological:  Negative for  light-headedness and headaches.  Psychiatric/Behavioral:  The patient is not nervous/anxious.    Maternal Medical History:  Reason for admission: Elective IOL   Contractions: Onset was more than 2 days ago.   Frequency: irregular.   Perceived severity is mild.   Fetal activity: Perceived fetal activity is normal.   Prenatal complications: no prenatal complications Prenatal Complications - Diabetes: none.   Dilation: 4 Effacement (%): 50 Station: -2 Exam by:: S moyer RN Blood pressure 138/79, pulse (!) 101, temperature 98.8 F (37.1 C), temperature source Oral, resp. rate 18, height 5' 7 (1.702 m), weight 85.3 kg, SpO2 98%, currently breastfeeding. Maternal Exam:  Uterine Assessment: Contraction strength is moderate.  Contraction frequency is irregular.  Abdomen: Patient reports generalized tenderness.  Estimated fetal weight is AGA.   Fetal presentation: vertex Introitus: Normal vulva. Normal vagina.  Pelvis: adequate for delivery.   Cervix: Cervix evaluated by digital exam.     Fetal Exam Fetal Monitor Review: Baseline rate: 145.  Variability: moderate (6-25 bpm).   Pattern: accelerations present and no decelerations.   Fetal State Assessment: Category I - tracings are normal.   Physical Exam Constitutional:      Appearance: Normal appearance. She is normal weight.  Cardiovascular:     Rate and Rhythm: Normal rate.     Pulses: Normal pulses.  Pulmonary:     Effort: Pulmonary effort is normal.  Abdominal:  Tenderness: There is generalized abdominal tenderness.  Genitourinary:    General: Normal vulva.  Musculoskeletal:        General: Normal range of motion.     Cervical back: Normal range of motion.  Skin:    General: Skin is warm and dry.     Capillary Refill: Capillary refill takes 2 to 3 seconds.  Neurological:     General: No focal deficit present.     Mental Status: She is alert and oriented to person, place, and time. Mental status is at baseline.      Prenatal labs: ABO, Rh: --/--/A POS (08/19 0830) Antibody: NEG (08/19 0830) Rubella:   RPR: Nonreactive (06/04 0000)  HBsAg: Negative (02/04 0000)  HIV: Non-reactive (02/04 0000)  GBS: Negative/-- (07/30 0000)   Assessment/Plan: 62bn [id number] female at 30 1/7wks for eIOL  -Admitted for expected mgmt  - Epidural given for pain relief - SVE 4.5/60/-2 ; AROM when able   - GBS neg - Expectant mgmt  Ted ORN Sheron Robin 10/10/2023, 10:05 AM

## 2023-10-10 NOTE — Anesthesia Preprocedure Evaluation (Signed)
 Anesthesia Evaluation  Patient identified by MRN, date of birth, ID band Patient awake    Reviewed: Allergy & Precautions, Patient's Chart, lab work & pertinent test results  Airway Mallampati: I       Dental no notable dental hx.    Pulmonary    Pulmonary exam normal        Cardiovascular Normal cardiovascular exam     Neuro/Psych  Headaches    GI/Hepatic   Endo/Other    Renal/GU      Musculoskeletal   Abdominal   Peds  Hematology negative hematology ROS (+)   Anesthesia Other Findings   Reproductive/Obstetrics (+) Pregnancy                              Anesthesia Physical Anesthesia Plan  ASA: II  Anesthesia Plan: Epidural   Post-op Pain Management:    Induction:   PONV Risk Score and Plan: 0  Airway Management Planned: Natural Airway  Additional Equipment: None  Intra-op Plan:   Post-operative Plan:   Informed Consent: I have reviewed the patients History and Physical, chart, labs and discussed the procedure including the risks, benefits and alternatives for the proposed anesthesia with the patient or authorized representative who has indicated his/her understanding and acceptance.       Plan Discussed with:   Anesthesia Plan Comments: (Lab Results      Component                Value               Date                      WBC                      10.1                03/23/2020                HGB                      13.8                03/23/2020                HCT                      41.4                03/23/2020                MCV                      87.9                03/23/2020                PLT                      255                 03/23/2020           )         Anesthesia Quick Evaluation

## 2023-10-10 NOTE — Lactation Note (Signed)
 This note was copied from a baby's chart. Lactation Consultation Note  Patient Name: Anna Martin Today's Date: 10/10/2023 Age:35 hours Reason for consult: Initial assessment;Term (See MOB: MR infant is LGA greater than 9 lbs at birth.)  C/O: MOB informed LC she has  hx of milk oversupply with previous children, she used lecithin supplement to help with clog ducts.  LC did not observe latch, infant has breastfeed 2x since delivery and MOB felt infant is latching well at the breast, no breastfeeding concerns at this time. MOB would like apply for STORK DEBP to use at 3-4 weeks postpartum. MOB will continue to breastfeed infant by cues, on demand, 8-12 times, skin to skin within 24 hours. MOB knows to call if she has breastfeeding questions, concerns or needs latch assistance. See Maternal data below MOB is experienced with  breastfeeding . LC discussed importance of maternal rest, meals and hydration. MOB was made aware of O/P services, breastfeeding support groups, community resources, and our phone # for post-discharge questions.      Maternal Data Has patient been taught Hand Expression?: Yes Does the patient have breastfeeding experience prior to this delivery?: Yes How long did the patient breastfeed?: MOB breastfeed all previous children for 20 months eacn.  Feeding Mother's Current Feeding Choice: Breast Milk  LATCH Score( Latch assessment done by RN).   Latch: Grasps breast easily, tongue down, lips flanged, rhythmical sucking.  Audible Swallowing: A few with stimulation  Type of Nipple: Everted at rest and after stimulation  Comfort (Breast/Nipple): Soft / non-tender  Hold (Positioning): No assistance needed to correctly position infant at breast.  LATCH Score: 9   Lactation Tools Discussed/Used    Interventions Interventions: Breast feeding basics reviewed;Position options;Hand express;Skin to skin;Education;LC Services brochure;Guidelines for Milk Supply and  Pumping Schedule Handout;CDC milk storage guidelines;CDC Guidelines for Breast Pump Cleaning  Discharge Pump: Referral sent for [facility] Pump  Consult Status Consult Status: Follow-up Date: 10/11/23 Follow-up type: In-patient    Grayce LULLA Batter 10/10/2023, 9:03 PM

## 2023-10-10 NOTE — Progress Notes (Signed)
 Resting comfortably  BP 109/67   Pulse (!) 106   Temp 98.3 F (36.8 C) (Oral)   Resp 16   Ht 5' 7 (1.702 m)   Wt 85.3 kg   SpO2 98%   BMI 29.44 kg/m   Toco: q3 minutes EFM: 140s moderate variability, + accelerations category 1 SVE: 6/80/-2  A/P: [id number] @ [redacted]w[redacted]d here for EIOL  EIOL: on pitocin , s/p AROM, entering active labor.  Continue to titrate pitocin . Anticipate SVD.  Pelvis proven to 8lb 15oz GBS negative

## 2023-10-11 LAB — CBC
HCT: 36.4 % (ref 36.0–46.0)
Hemoglobin: 12.4 g/dL (ref 12.0–15.0)
MCH: 30 pg (ref 26.0–34.0)
MCHC: 34.1 g/dL (ref 30.0–36.0)
MCV: 87.9 fL (ref 80.0–100.0)
Platelets: 188 K/uL (ref 150–400)
RBC: 4.14 MIL/uL (ref 3.87–5.11)
RDW: 13.2 % (ref 11.5–15.5)
WBC: 12.9 K/uL — ABNORMAL HIGH (ref 4.0–10.5)
nRBC: 0 % (ref 0.0–0.2)

## 2023-10-11 MED ORDER — IBUPROFEN 600 MG PO TABS: 600.0000 mg | ORAL_TABLET | Freq: Four times a day (QID) | ORAL | Status: AC

## 2023-10-11 NOTE — Lactation Note (Signed)
 This note was copied from a baby's chart. Lactation Consultation Note  Patient Name: Anna Martin Today's Date: 10/11/2023 Age:35 hours  Reason for consult: Follow-up assessment;Term  P4, [redacted]w[redacted]d, 2.3% weight loss  Stork pump taken to mother's room. Mother reports baby is breastfeeding well. She reports baby has already started cluster feeding. Baby was currently alert, awake and mother was getting ready to latch. She denied any questions, concerns or need for assistance.  Mother will call for [facility] assist, if needed. This is mother's 4th baby and she has breast feeding experience.    Feeding Mother's Current Feeding Choice: Breast Milk  Interventions Interventions: Education  Discharge Pump: Received Stork Pump (Spectra  breast pump taken to room)  Consult Status Consult Status: Complete Date: 10/11/23    Joshua Rojelio HERO 10/11/2023, 1:22 PM

## 2023-10-11 NOTE — Progress Notes (Signed)
 Post Partum Day 1 Subjective: no complaints, up ad lib, voiding, tolerating PO, + flatus, and breastfeeding is going well. Lochia appropriate. Strongly desires discharge today.   Objective: Blood pressure 114/63, pulse 85, temperature 97.6 F (36.4 C), resp. rate 18, height 5' 7 (1.702 m), weight 85.3 kg, SpO2 98%, unknown if currently breastfeeding.  Physical Exam:  General: alert, cooperative, fatigued, and no distress Lochia: appropriate Uterine Fundus: firm DVT Evaluation: No evidence of DVT seen on physical exam. Trace edema bilaterally.  Recent Labs    10/10/23 0819 10/11/23 0440  HGB 13.2 12.4  HCT 38.7 36.4    Assessment/Plan: 33yo G5 now P4014 PPD1 s/p SVD at [redacted]w[redacted]d after eIOL -PPD1: routine care, doing well.   Dispo: Discharge to room vs. Home pending baby dispo.    LOS: 1 day   Rubie DELENA Husky, MD 10/11/2023, 10:55 AM

## 2023-10-11 NOTE — Anesthesia Postprocedure Evaluation (Signed)
 Anesthesia Post Note  Patient: Caylin E Varkey  Procedure(s) Performed: AN AD HOC LABOR EPIDURAL     Patient location during evaluation: Mother Baby Anesthesia Type: Epidural Level of consciousness: awake, oriented and awake and alert Pain management: pain level controlled Vital Signs Assessment: post-procedure vital signs reviewed and stable Respiratory status: spontaneous breathing, nonlabored ventilation and respiratory function stable Cardiovascular status: stable Postop Assessment: patient able to bend at knees, adequate PO intake, no apparent nausea or vomiting and able to ambulate Anesthetic complications: no   No notable events documented.  Last Vitals:  Vitals:   10/11/23 0135 10/11/23 0540  BP: 109/72 111/74  Pulse: 93 98  Resp: 18 18  Temp: 37 C 36.9 C  SpO2: 97% 100%    Last Pain:  Vitals:   10/11/23 0541  TempSrc:   PainSc: 0-No pain   Pain Goal:                   Cinnamon Morency

## 2023-10-11 NOTE — Discharge Summary (Signed)
 Postpartum Discharge Summary  Date of Service updated 8/19-8/20/2025     Patient Name: Anna Martin DOB: Jan 20, 1991 MRN: [id number]  Date of admission: 10/10/2023 Delivery date:10/10/2023 Delivering provider: GRETTA GUMS Date of discharge: 10/11/2023  Admitting diagnosis: Indication for care in labor or delivery [O75.9] Intrauterine pregnancy: [redacted]w[redacted]d     Secondary diagnosis:  Principal Problem:   Indication for care in labor or delivery  Additional problems: 80s shoulder dystocia    Discharge diagnosis: Term Pregnancy Delivered                                              Post partum procedures:none Augmentation: AROM and Pitocin  Complications: None  Hospital course: Induction of Labor With Vaginal Delivery   33 y.o. yo [id number] at [redacted]w[redacted]d was admitted to the hospital 10/10/2023 for induction of labor.  Indication for induction: Elective.  Patient had an labor course complicated by 80s shoulder dystocia Membrane Rupture Time/Date: 11:26 AM,10/10/2023  Delivery Method:Vaginal, Spontaneous Episiotomy: None Lacerations:  None Details of delivery can be found in separate delivery note.  Patient had a postpartum course complicated by nothing. Patient is discharged home 10/11/23.  Newborn Data: Birth date:10/10/2023 Birth time:5:27 PM Gender:Female Living status:Living Apgars:5 ,9  Weight:4480 g  Magnesium Sulfate received: No BMZ received: No Rhophylac:No MMR:No T-DaP:Given prenatally Flu: No RSV Vaccine received: No Transfusion:No Immunizations administered: There is no immunization history for the selected administration types on file for this patient.  Physical exam  Vitals:   10/10/23 2131 10/11/23 0135 10/11/23 0540 10/11/23 1030  BP: 126/69 109/72 111/74 114/63  Pulse: 100 93 98 85  Resp: 18 18 18    Temp: 98.7 F (37.1 C) 98.6 F (37 C) 98.5 F (36.9 C) 97.6 F (36.4 C)  TempSrc: Oral Oral Oral   SpO2: 98% 97% 100% 98%  Weight:      Height:        General: alert, cooperative, and no distress Lochia: appropriate Uterine Fundus: firm DVT Evaluation: No evidence of DVT seen on physical exam. Labs: Lab Results  Component Value Date   WBC 12.9 (H) 10/11/2023   HGB 12.4 10/11/2023   HCT 36.4 10/11/2023   MCV 87.9 10/11/2023   PLT 188 10/11/2023      Latest Ref Rng & Units 01/15/2014    5:46 PM  CMP  Glucose 70 - 99 mg/dL 77   BUN 6 - 23 mg/dL 7   Creatinine 9.49 - 8.89 mg/dL 9.41   Sodium 862 - 852 mEq/L 136   Potassium 3.7 - 5.3 mEq/L 3.6   Chloride 96 - 112 mEq/L 100   CO2 19 - 32 mEq/L 23   Calcium 8.4 - 10.5 mg/dL 8.4    Edinburgh Score:    03/25/2020    8:08 AM  Edinburgh Postnatal Depression Scale Screening Tool  I have been able to laugh and see the funny side of things. 0  I have looked forward with enjoyment to things. 0  I have blamed myself unnecessarily when things went wrong. 0  I have been anxious or worried for no good reason. 0  I have felt scared or panicky for no good reason. 0  Things have been getting on top of me. 0  I have been so unhappy that I have had difficulty sleeping. 0  I have felt sad or miserable. 0  I  have been so unhappy that I have been crying. 0  The thought of harming myself has occurred to me. 0  Edinburgh Postnatal Depression Scale Total 0      Data saved with a previous flowsheet row definition      After visit meds:  Allergies as of 10/11/2023   No Known Allergies      Medication List     STOP taking these medications    dicyclomine  10 MG capsule Commonly known as: BENTYL    norethindrone  0.35 MG tablet Commonly known as: MICRONOR    Slynd  4 MG Tabs Generic drug: Drospirenone        TAKE these medications    acetaminophen  325 MG tablet Commonly known as: Tylenol  Take 2 tablets (650 mg total) by mouth every 4 (four) hours as needed (for pain scale < 4).   ibuprofen  600 MG tablet Commonly known as: ADVIL  Take 1 tablet (600 mg total) by mouth every 6  (six) hours. What changed: when to take this   prenatal multivitamin Tabs tablet Take 1 tablet by mouth at bedtime.         Discharge home in stable condition Infant Feeding: Breast Infant Disposition:home with mother Discharge instruction: per After Visit Summary and Postpartum booklet. Activity: Advance as tolerated. Pelvic rest for 6 weeks.  Diet: routine diet Anticipated Birth Control: Unsure Postpartum Appointment:4 weeks Additional Postpartum F/U: routine Future Appointments:No future appointments. Follow up Visit:  Follow-up Information     Gretta Gums, MD. Schedule an appointment as soon as possible for a visit in 4 week(s).   Specialty: Obstetrics Contact information: [address] [place] [address] [phone]                     10/11/2023 Anna DELENA Husky, MD

## 2023-10-19 ENCOUNTER — Telehealth (HOSPITAL_COMMUNITY): Payer: Self-pay

## 2023-10-19 NOTE — Telephone Encounter (Signed)
 10/19/2023 1815  Name: SHANELLE CLONTZ MRN: [id number] DOB: [birth year]  Reason for Call:  Transition of Care Hospital Discharge Call  Contact Status: Patient Contact Status: Message  Language assistant needed:          Follow-Up Questions:    Van Postnatal Depression Scale:  In the Past 7 Days:    PHQ2-9 Depression Scale:     Discharge Follow-up:    Post-discharge interventions: NA  Signature  Rosaline Deretha PEAK

## 2023-10-23 ENCOUNTER — Encounter (HOSPITAL_COMMUNITY)

## 2023-10-25 ENCOUNTER — Inpatient Hospital Stay (HOSPITAL_COMMUNITY)
Admission: RE | Admit: 2023-10-25 | Discharge: 2023-10-11 | DRG: 807 | Disposition: A | Discharge status: Home / Self Care | Attending: Obstetrics and Gynecology | Admitting: Obstetrics and Gynecology

## 2023-10-25 ENCOUNTER — Inpatient Hospital Stay (HOSPITAL_COMMUNITY)
